# Patient Record
Sex: Female | Born: 1950 | Race: White | Hispanic: No | Smoking: Former smoker
Health system: Southern US, Community
[De-identification: ages and names within clinical notes are randomized; demographics above are authoritative.]

## PROBLEM LIST (undated history)

## (undated) DIAGNOSIS — I10 Essential (primary) hypertension: Secondary | ICD-10-CM

## (undated) DIAGNOSIS — T8859XA Other complications of anesthesia, initial encounter: Secondary | ICD-10-CM

## (undated) DIAGNOSIS — J449 Chronic obstructive pulmonary disease, unspecified: Secondary | ICD-10-CM

## (undated) DIAGNOSIS — F32A Depression, unspecified: Secondary | ICD-10-CM

## (undated) DIAGNOSIS — G2581 Restless legs syndrome: Secondary | ICD-10-CM

## (undated) DIAGNOSIS — E785 Hyperlipidemia, unspecified: Secondary | ICD-10-CM

## (undated) DIAGNOSIS — K219 Gastro-esophageal reflux disease without esophagitis: Secondary | ICD-10-CM

## (undated) DIAGNOSIS — F419 Anxiety disorder, unspecified: Secondary | ICD-10-CM

## (undated) DIAGNOSIS — M5030 Other cervical disc degeneration, unspecified cervical region: Secondary | ICD-10-CM

## (undated) DIAGNOSIS — Z9981 Dependence on supplemental oxygen: Secondary | ICD-10-CM

## (undated) DIAGNOSIS — E669 Obesity, unspecified: Secondary | ICD-10-CM

## (undated) DIAGNOSIS — F319 Bipolar disorder, unspecified: Secondary | ICD-10-CM

## (undated) DIAGNOSIS — R0600 Dyspnea, unspecified: Secondary | ICD-10-CM

## (undated) HISTORY — PX: ANKLE HARDWARE REMOVAL: SHX1149

## (undated) HISTORY — PX: AUGMENTATION MAMMAPLASTY: SUR837

## (undated) HISTORY — PX: ANKLE FRACTURE SURGERY: SHX122

## (undated) HISTORY — PX: COLONOSCOPY: SHX174

## (undated) HISTORY — PX: TUBAL LIGATION: SHX77

---

## 2008-03-18 ENCOUNTER — Ambulatory Visit: Payer: Self-pay | Admitting: Gastroenterology

## 2008-07-11 ENCOUNTER — Ambulatory Visit: Payer: Self-pay | Admitting: Family Medicine

## 2010-08-02 ENCOUNTER — Ambulatory Visit: Payer: Self-pay | Admitting: Family Medicine

## 2010-10-23 NOTE — Assessment & Plan Note (Signed)
Summary: FLU SHOT/EVM  Nurse Visit   Immunizations Administered:  Influenza Vaccine:    Vaccine Type: FLULAVAL    Site: left deltoid    Mfr: GlaxoSmithKline    Dose: 0.5 ml    Route: IM    Given by: Levonne Spiller EMT-P    Exp. Date: 03/23/2011    Lot #: [id number]    VIS given: 04/17/10 version given August 02, 2010.   Immunizations Administered:  Influenza Vaccine:    Vaccine Type: FLULAVAL    Site: left deltoid    Mfr: GlaxoSmithKline    Dose: 0.5 ml    Route: IM    Given by: Levonne Spiller EMT-P    Exp. Date: 03/23/2011    Lot #: [id number]    VIS given: 04/17/10 version given August 02, 2010.  Flu Vaccine Consent Questions:    Do you have a history of severe allergic reactions to this vaccine? no    Any prior history of allergic reactions to egg and/or gelatin? no    Do you have a sensitivity to the preservative Thimersol? no    Do you have a past history of Guillan-Barre Syndrome? no    Do you currently have an acute febrile illness? no    Have you ever had a severe reaction to latex? no    Vaccine information given and explained to patient? yes    Are you currently pregnant? no

## 2011-01-30 ENCOUNTER — Ambulatory Visit: Payer: Self-pay | Admitting: Internal Medicine

## 2012-04-30 ENCOUNTER — Ambulatory Visit: Payer: Self-pay | Admitting: Internal Medicine

## 2013-05-03 ENCOUNTER — Ambulatory Visit: Payer: Self-pay | Admitting: Internal Medicine

## 2013-08-24 ENCOUNTER — Ambulatory Visit: Payer: Self-pay | Admitting: Gastroenterology

## 2013-11-29 ENCOUNTER — Other Ambulatory Visit: Payer: Self-pay | Admitting: Gastroenterology

## 2013-11-29 LAB — CLOSTRIDIUM DIFFICILE(ARMC)

## 2013-12-03 LAB — STOOL CULTURE

## 2014-04-27 DIAGNOSIS — E785 Hyperlipidemia, unspecified: Secondary | ICD-10-CM | POA: Insufficient documentation

## 2014-04-27 DIAGNOSIS — F319 Bipolar disorder, unspecified: Secondary | ICD-10-CM | POA: Insufficient documentation

## 2014-04-27 DIAGNOSIS — I10 Essential (primary) hypertension: Secondary | ICD-10-CM | POA: Insufficient documentation

## 2014-04-27 DIAGNOSIS — G2581 Restless legs syndrome: Secondary | ICD-10-CM | POA: Insufficient documentation

## 2014-04-27 DIAGNOSIS — M5030 Other cervical disc degeneration, unspecified cervical region: Secondary | ICD-10-CM | POA: Insufficient documentation

## 2014-06-01 ENCOUNTER — Ambulatory Visit: Payer: Self-pay | Admitting: Internal Medicine

## 2014-09-25 ENCOUNTER — Ambulatory Visit: Payer: Self-pay | Admitting: Physician Assistant

## 2014-09-25 DIAGNOSIS — M549 Dorsalgia, unspecified: Secondary | ICD-10-CM | POA: Diagnosis not present

## 2014-09-25 DIAGNOSIS — I10 Essential (primary) hypertension: Secondary | ICD-10-CM | POA: Diagnosis not present

## 2014-09-25 DIAGNOSIS — G8929 Other chronic pain: Secondary | ICD-10-CM | POA: Diagnosis not present

## 2014-09-25 DIAGNOSIS — B351 Tinea unguium: Secondary | ICD-10-CM | POA: Diagnosis not present

## 2014-09-25 DIAGNOSIS — F319 Bipolar disorder, unspecified: Secondary | ICD-10-CM | POA: Diagnosis not present

## 2014-09-25 DIAGNOSIS — L089 Local infection of the skin and subcutaneous tissue, unspecified: Secondary | ICD-10-CM | POA: Diagnosis not present

## 2014-11-01 DIAGNOSIS — R7309 Other abnormal glucose: Secondary | ICD-10-CM | POA: Diagnosis not present

## 2014-11-01 DIAGNOSIS — E119 Type 2 diabetes mellitus without complications: Secondary | ICD-10-CM | POA: Diagnosis not present

## 2014-11-08 DIAGNOSIS — I10 Essential (primary) hypertension: Secondary | ICD-10-CM | POA: Diagnosis not present

## 2015-01-31 DIAGNOSIS — I10 Essential (primary) hypertension: Secondary | ICD-10-CM | POA: Diagnosis not present

## 2015-02-07 DIAGNOSIS — I10 Essential (primary) hypertension: Secondary | ICD-10-CM | POA: Diagnosis not present

## 2015-02-07 DIAGNOSIS — R739 Hyperglycemia, unspecified: Secondary | ICD-10-CM | POA: Diagnosis not present

## 2015-02-07 DIAGNOSIS — E780 Pure hypercholesterolemia: Secondary | ICD-10-CM | POA: Diagnosis not present

## 2015-05-02 DIAGNOSIS — E780 Pure hypercholesterolemia: Secondary | ICD-10-CM | POA: Diagnosis not present

## 2015-05-02 DIAGNOSIS — I10 Essential (primary) hypertension: Secondary | ICD-10-CM | POA: Diagnosis not present

## 2015-05-02 DIAGNOSIS — R739 Hyperglycemia, unspecified: Secondary | ICD-10-CM | POA: Diagnosis not present

## 2015-05-08 DIAGNOSIS — F319 Bipolar disorder, unspecified: Secondary | ICD-10-CM | POA: Diagnosis not present

## 2015-05-08 DIAGNOSIS — I10 Essential (primary) hypertension: Secondary | ICD-10-CM | POA: Diagnosis not present

## 2015-05-08 DIAGNOSIS — Z1231 Encounter for screening mammogram for malignant neoplasm of breast: Secondary | ICD-10-CM | POA: Diagnosis not present

## 2015-05-08 DIAGNOSIS — M79605 Pain in left leg: Secondary | ICD-10-CM | POA: Diagnosis not present

## 2015-05-08 DIAGNOSIS — Z0001 Encounter for general adult medical examination with abnormal findings: Secondary | ICD-10-CM | POA: Diagnosis not present

## 2015-05-08 DIAGNOSIS — M5030 Other cervical disc degeneration, unspecified cervical region: Secondary | ICD-10-CM | POA: Diagnosis not present

## 2015-05-08 DIAGNOSIS — Z23 Encounter for immunization: Secondary | ICD-10-CM | POA: Diagnosis not present

## 2015-05-08 DIAGNOSIS — G2581 Restless legs syndrome: Secondary | ICD-10-CM | POA: Diagnosis not present

## 2015-05-08 DIAGNOSIS — E780 Pure hypercholesterolemia: Secondary | ICD-10-CM | POA: Diagnosis not present

## 2015-06-19 ENCOUNTER — Other Ambulatory Visit: Payer: Self-pay | Admitting: Internal Medicine

## 2015-06-19 DIAGNOSIS — Z1231 Encounter for screening mammogram for malignant neoplasm of breast: Secondary | ICD-10-CM

## 2015-06-29 ENCOUNTER — Ambulatory Visit
Admission: RE | Admit: 2015-06-29 | Discharge: 2015-06-29 | Disposition: A | Discharge status: Home / Self Care | Payer: Commercial Managed Care - HMO | Source: Ambulatory Visit | Attending: Internal Medicine | Admitting: Internal Medicine

## 2015-06-29 ENCOUNTER — Other Ambulatory Visit: Payer: Self-pay | Admitting: Internal Medicine

## 2015-06-29 DIAGNOSIS — Z1231 Encounter for screening mammogram for malignant neoplasm of breast: Secondary | ICD-10-CM | POA: Insufficient documentation

## 2015-08-01 DIAGNOSIS — R7309 Other abnormal glucose: Secondary | ICD-10-CM | POA: Diagnosis not present

## 2015-08-01 DIAGNOSIS — R300 Dysuria: Secondary | ICD-10-CM | POA: Diagnosis not present

## 2015-08-01 DIAGNOSIS — I10 Essential (primary) hypertension: Secondary | ICD-10-CM | POA: Diagnosis not present

## 2015-08-08 DIAGNOSIS — E7800 Pure hypercholesterolemia, unspecified: Secondary | ICD-10-CM | POA: Diagnosis not present

## 2015-08-08 DIAGNOSIS — G2581 Restless legs syndrome: Secondary | ICD-10-CM | POA: Diagnosis not present

## 2015-08-08 DIAGNOSIS — I10 Essential (primary) hypertension: Secondary | ICD-10-CM | POA: Diagnosis not present

## 2015-10-26 DIAGNOSIS — K580 Irritable bowel syndrome with diarrhea: Secondary | ICD-10-CM | POA: Diagnosis not present

## 2015-11-08 DIAGNOSIS — I10 Essential (primary) hypertension: Secondary | ICD-10-CM | POA: Diagnosis not present

## 2015-11-08 DIAGNOSIS — G2581 Restless legs syndrome: Secondary | ICD-10-CM | POA: Diagnosis not present

## 2015-11-08 DIAGNOSIS — F319 Bipolar disorder, unspecified: Secondary | ICD-10-CM | POA: Diagnosis not present

## 2015-11-08 DIAGNOSIS — R739 Hyperglycemia, unspecified: Secondary | ICD-10-CM | POA: Diagnosis not present

## 2015-11-08 DIAGNOSIS — M5030 Other cervical disc degeneration, unspecified cervical region: Secondary | ICD-10-CM | POA: Diagnosis not present

## 2016-01-24 DIAGNOSIS — R739 Hyperglycemia, unspecified: Secondary | ICD-10-CM | POA: Diagnosis not present

## 2016-02-01 DIAGNOSIS — F319 Bipolar disorder, unspecified: Secondary | ICD-10-CM | POA: Diagnosis not present

## 2016-02-01 DIAGNOSIS — I10 Essential (primary) hypertension: Secondary | ICD-10-CM | POA: Diagnosis not present

## 2016-02-01 DIAGNOSIS — E7800 Pure hypercholesterolemia, unspecified: Secondary | ICD-10-CM | POA: Diagnosis not present

## 2016-05-01 DIAGNOSIS — E7800 Pure hypercholesterolemia, unspecified: Secondary | ICD-10-CM | POA: Diagnosis not present

## 2016-05-01 DIAGNOSIS — I10 Essential (primary) hypertension: Secondary | ICD-10-CM | POA: Diagnosis not present

## 2016-05-07 ENCOUNTER — Other Ambulatory Visit: Payer: Self-pay | Admitting: Internal Medicine

## 2016-05-07 DIAGNOSIS — Z1231 Encounter for screening mammogram for malignant neoplasm of breast: Secondary | ICD-10-CM

## 2016-05-07 DIAGNOSIS — I10 Essential (primary) hypertension: Secondary | ICD-10-CM | POA: Diagnosis not present

## 2016-05-07 DIAGNOSIS — G2581 Restless legs syndrome: Secondary | ICD-10-CM | POA: Diagnosis not present

## 2016-05-07 DIAGNOSIS — F319 Bipolar disorder, unspecified: Secondary | ICD-10-CM | POA: Diagnosis not present

## 2016-05-07 DIAGNOSIS — E7800 Pure hypercholesterolemia, unspecified: Secondary | ICD-10-CM | POA: Diagnosis not present

## 2016-05-07 DIAGNOSIS — Z0001 Encounter for general adult medical examination with abnormal findings: Secondary | ICD-10-CM | POA: Diagnosis not present

## 2016-07-09 ENCOUNTER — Ambulatory Visit
Admission: RE | Admit: 2016-07-09 | Discharge: 2016-07-09 | Disposition: A | Discharge status: Home / Self Care | Payer: Commercial Managed Care - HMO | Source: Ambulatory Visit | Attending: Internal Medicine | Admitting: Internal Medicine

## 2016-07-09 ENCOUNTER — Other Ambulatory Visit: Payer: Self-pay | Admitting: Internal Medicine

## 2016-07-09 DIAGNOSIS — Z1231 Encounter for screening mammogram for malignant neoplasm of breast: Secondary | ICD-10-CM

## 2016-07-30 DIAGNOSIS — I10 Essential (primary) hypertension: Secondary | ICD-10-CM | POA: Diagnosis not present

## 2016-08-06 DIAGNOSIS — I10 Essential (primary) hypertension: Secondary | ICD-10-CM | POA: Diagnosis not present

## 2016-10-29 DIAGNOSIS — I10 Essential (primary) hypertension: Secondary | ICD-10-CM | POA: Diagnosis not present

## 2016-11-05 DIAGNOSIS — F319 Bipolar disorder, unspecified: Secondary | ICD-10-CM | POA: Diagnosis not present

## 2016-11-05 DIAGNOSIS — I10 Essential (primary) hypertension: Secondary | ICD-10-CM | POA: Diagnosis not present

## 2016-11-05 DIAGNOSIS — E7800 Pure hypercholesterolemia, unspecified: Secondary | ICD-10-CM | POA: Diagnosis not present

## 2017-05-06 DIAGNOSIS — E7800 Pure hypercholesterolemia, unspecified: Secondary | ICD-10-CM | POA: Diagnosis not present

## 2017-05-06 DIAGNOSIS — I10 Essential (primary) hypertension: Secondary | ICD-10-CM | POA: Diagnosis not present

## 2017-05-12 DIAGNOSIS — K580 Irritable bowel syndrome with diarrhea: Secondary | ICD-10-CM | POA: Diagnosis not present

## 2017-05-12 DIAGNOSIS — Z86010 Personal history of colonic polyps: Secondary | ICD-10-CM | POA: Diagnosis not present

## 2017-05-13 DIAGNOSIS — I10 Essential (primary) hypertension: Secondary | ICD-10-CM | POA: Diagnosis not present

## 2017-05-13 DIAGNOSIS — Z0001 Encounter for general adult medical examination with abnormal findings: Secondary | ICD-10-CM | POA: Diagnosis not present

## 2017-05-13 DIAGNOSIS — E7800 Pure hypercholesterolemia, unspecified: Secondary | ICD-10-CM | POA: Diagnosis not present

## 2017-05-13 DIAGNOSIS — F319 Bipolar disorder, unspecified: Secondary | ICD-10-CM | POA: Diagnosis not present

## 2017-05-13 DIAGNOSIS — Z1231 Encounter for screening mammogram for malignant neoplasm of breast: Secondary | ICD-10-CM | POA: Diagnosis not present

## 2017-05-13 DIAGNOSIS — G2581 Restless legs syndrome: Secondary | ICD-10-CM | POA: Diagnosis not present

## 2017-05-13 DIAGNOSIS — M5030 Other cervical disc degeneration, unspecified cervical region: Secondary | ICD-10-CM | POA: Diagnosis not present

## 2017-05-19 ENCOUNTER — Other Ambulatory Visit: Payer: Self-pay | Admitting: Internal Medicine

## 2017-05-19 DIAGNOSIS — Z1231 Encounter for screening mammogram for malignant neoplasm of breast: Secondary | ICD-10-CM

## 2017-05-30 DIAGNOSIS — Z86010 Personal history of colonic polyps: Secondary | ICD-10-CM | POA: Diagnosis not present

## 2017-07-15 ENCOUNTER — Ambulatory Visit
Admission: RE | Admit: 2017-07-15 | Discharge: 2017-07-15 | Disposition: A | Discharge status: Home / Self Care | Payer: Commercial Managed Care - HMO | Source: Ambulatory Visit | Attending: Internal Medicine | Admitting: Internal Medicine

## 2017-07-15 DIAGNOSIS — Z9882 Breast implant status: Secondary | ICD-10-CM | POA: Insufficient documentation

## 2017-07-15 DIAGNOSIS — Z1231 Encounter for screening mammogram for malignant neoplasm of breast: Secondary | ICD-10-CM | POA: Diagnosis not present

## 2017-11-04 DIAGNOSIS — E7800 Pure hypercholesterolemia, unspecified: Secondary | ICD-10-CM | POA: Diagnosis not present

## 2017-11-04 DIAGNOSIS — I10 Essential (primary) hypertension: Secondary | ICD-10-CM | POA: Diagnosis not present

## 2017-11-11 DIAGNOSIS — Z6841 Body Mass Index (BMI) 40.0 and over, adult: Secondary | ICD-10-CM | POA: Diagnosis not present

## 2017-11-11 DIAGNOSIS — F3178 Bipolar disorder, in full remission, most recent episode mixed: Secondary | ICD-10-CM | POA: Diagnosis not present

## 2017-11-11 DIAGNOSIS — M5030 Other cervical disc degeneration, unspecified cervical region: Secondary | ICD-10-CM | POA: Diagnosis not present

## 2017-11-11 DIAGNOSIS — E7800 Pure hypercholesterolemia, unspecified: Secondary | ICD-10-CM | POA: Diagnosis not present

## 2017-11-11 DIAGNOSIS — I10 Essential (primary) hypertension: Secondary | ICD-10-CM | POA: Diagnosis not present

## 2017-11-11 DIAGNOSIS — F319 Bipolar disorder, unspecified: Secondary | ICD-10-CM | POA: Diagnosis not present

## 2017-11-11 DIAGNOSIS — R0602 Shortness of breath: Secondary | ICD-10-CM | POA: Diagnosis not present

## 2018-02-10 DIAGNOSIS — F411 Generalized anxiety disorder: Secondary | ICD-10-CM | POA: Diagnosis not present

## 2018-02-10 DIAGNOSIS — M5030 Other cervical disc degeneration, unspecified cervical region: Secondary | ICD-10-CM | POA: Diagnosis not present

## 2018-02-10 DIAGNOSIS — E7800 Pure hypercholesterolemia, unspecified: Secondary | ICD-10-CM | POA: Diagnosis not present

## 2018-02-10 DIAGNOSIS — Z0000 Encounter for general adult medical examination without abnormal findings: Secondary | ICD-10-CM | POA: Diagnosis not present

## 2018-02-10 DIAGNOSIS — F319 Bipolar disorder, unspecified: Secondary | ICD-10-CM | POA: Diagnosis not present

## 2018-02-10 DIAGNOSIS — I10 Essential (primary) hypertension: Secondary | ICD-10-CM | POA: Diagnosis not present

## 2018-05-12 DIAGNOSIS — I10 Essential (primary) hypertension: Secondary | ICD-10-CM | POA: Diagnosis not present

## 2018-05-19 DIAGNOSIS — E7800 Pure hypercholesterolemia, unspecified: Secondary | ICD-10-CM | POA: Diagnosis not present

## 2018-05-19 DIAGNOSIS — F319 Bipolar disorder, unspecified: Secondary | ICD-10-CM | POA: Diagnosis not present

## 2018-05-19 DIAGNOSIS — M5030 Other cervical disc degeneration, unspecified cervical region: Secondary | ICD-10-CM | POA: Diagnosis not present

## 2018-05-19 DIAGNOSIS — I10 Essential (primary) hypertension: Secondary | ICD-10-CM | POA: Diagnosis not present

## 2018-07-06 ENCOUNTER — Other Ambulatory Visit: Payer: Self-pay | Admitting: Internal Medicine

## 2018-07-06 DIAGNOSIS — Z1231 Encounter for screening mammogram for malignant neoplasm of breast: Secondary | ICD-10-CM

## 2018-07-16 ENCOUNTER — Ambulatory Visit
Admission: RE | Admit: 2018-07-16 | Discharge: 2018-07-16 | Disposition: A | Discharge status: Home / Self Care | Payer: Medicare HMO | Source: Ambulatory Visit | Attending: Internal Medicine | Admitting: Internal Medicine

## 2018-07-16 DIAGNOSIS — Z1231 Encounter for screening mammogram for malignant neoplasm of breast: Secondary | ICD-10-CM | POA: Diagnosis not present

## 2018-07-28 ENCOUNTER — Emergency Department: Payer: Medicare HMO

## 2018-07-28 ENCOUNTER — Emergency Department
Admission: EM | Admit: 2018-07-28 | Discharge: 2018-07-28 | Disposition: A | Discharge status: Home / Self Care | Payer: Medicare HMO | Attending: Student in an Organized Health Care Education/Training Program | Admitting: Student in an Organized Health Care Education/Training Program

## 2018-07-28 ENCOUNTER — Encounter: Payer: Self-pay | Admitting: Emergency Medicine

## 2018-07-28 ENCOUNTER — Other Ambulatory Visit: Payer: Self-pay

## 2018-07-28 DIAGNOSIS — R0902 Hypoxemia: Secondary | ICD-10-CM | POA: Diagnosis not present

## 2018-07-28 DIAGNOSIS — Z87891 Personal history of nicotine dependence: Secondary | ICD-10-CM | POA: Diagnosis not present

## 2018-07-28 DIAGNOSIS — R0602 Shortness of breath: Secondary | ICD-10-CM

## 2018-07-28 DIAGNOSIS — Z5329 Procedure and treatment not carried out because of patient's decision for other reasons: Secondary | ICD-10-CM | POA: Insufficient documentation

## 2018-07-28 DIAGNOSIS — J9601 Acute respiratory failure with hypoxia: Secondary | ICD-10-CM

## 2018-07-28 DIAGNOSIS — R000 Tachycardia, unspecified: Secondary | ICD-10-CM | POA: Diagnosis not present

## 2018-07-28 LAB — CBC
HEMATOCRIT: 47.1 % — AB (ref 36.0–46.0)
HEMOGLOBIN: 14.8 g/dL (ref 12.0–15.0)
MCH: 31.2 pg (ref 26.0–34.0)
MCHC: 31.4 g/dL (ref 30.0–36.0)
MCV: 99.2 fL (ref 80.0–100.0)
Platelets: 291 10*3/uL (ref 150–400)
RBC: 4.75 MIL/uL (ref 3.87–5.11)
RDW: 12.7 % (ref 11.5–15.5)
WBC: 10.6 10*3/uL — AB (ref 4.0–10.5)
nRBC: 0 % (ref 0.0–0.2)

## 2018-07-28 LAB — BASIC METABOLIC PANEL
Anion gap: 8 (ref 5–15)
BUN: 22 mg/dL (ref 8–23)
CHLORIDE: 103 mmol/L (ref 98–111)
CO2: 29 mmol/L (ref 22–32)
Calcium: 9.3 mg/dL (ref 8.9–10.3)
Creatinine, Ser: 0.75 mg/dL (ref 0.44–1.00)
GFR calc Af Amer: >60 mL/min (ref >60)
GFR calc non Af Amer: >60 mL/min (ref >60)
Glucose, Bld: 163 mg/dL — ABNORMAL HIGH (ref 70–99)
POTASSIUM: 4.2 mmol/L (ref 3.5–5.1)
Sodium: 140 mmol/L (ref 135–145)

## 2018-07-28 LAB — BRAIN NATRIURETIC PEPTIDE: B Natriuretic Peptide: 8 pg/mL (ref 0.0–100.0)

## 2018-07-28 MED ORDER — IPRATROPIUM-ALBUTEROL 0.5-2.5 (3) MG/3ML IN SOLN: 3.0000 mL | Freq: Four times a day (QID) | RESPIRATORY_TRACT | 1 refills | Status: AC | PRN

## 2018-07-28 MED ORDER — IPRATROPIUM-ALBUTEROL 0.5-2.5 (3) MG/3ML IN SOLN
3.0000 mL | Freq: Once | RESPIRATORY_TRACT | Status: AC
  Administered 2018-07-28: 3 mL via RESPIRATORY_TRACT
  Filled 2018-07-28: qty 3

## 2018-07-28 MED ORDER — PREDNISONE 20 MG PO TABS
60.0000 mg | ORAL_TABLET | Freq: Once | ORAL | Status: AC
  Administered 2018-07-28: 60 mg via ORAL
  Filled 2018-07-28: qty 3

## 2018-07-28 MED ORDER — ALBUTEROL SULFATE HFA 108 (90 BASE) MCG/ACT IN AERS: 2.0000 | INHALATION_SPRAY | Freq: Four times a day (QID) | RESPIRATORY_TRACT | 2 refills | Status: AC | PRN

## 2018-07-28 MED ORDER — PREDNISONE 20 MG PO TABS: 40.0000 mg | ORAL_TABLET | Freq: Every day | ORAL | 0 refills | Status: AC

## 2018-07-28 MED ORDER — ALBUTEROL SULFATE (2.5 MG/3ML) 0.083% IN NEBU
5.0000 mg | INHALATION_SOLUTION | Freq: Once | RESPIRATORY_TRACT | Status: AC
  Administered 2018-07-28: 5 mg via RESPIRATORY_TRACT
  Filled 2018-07-28: qty 6

## 2018-07-28 MED ORDER — METHYLPREDNISOLONE SODIUM SUCC 125 MG IJ SOLR: 125.0000 mg | Freq: Once | INTRAMUSCULAR | Status: DC

## 2018-07-28 NOTE — ED Notes (Signed)
Patient requested to speak Dr. Quentin Cornwall. Patient states she is not staying and wants to go home. Dr. Quentin Cornwall aware.

## 2018-07-28 NOTE — Discharge Instructions (Addendum)
Please take the steroids daily starting tomorrow.  Use the albuterol inhaler every 4 hours while awake for the next 3 days.  Please return to the ER after you have gotten your affairs at home coordinated for reevaluation as you are leaving [place] there are other pathologies that can cause your symptoms including but not limited to congestive heart failure, pulmonary embolism, myocardial infarction, pneumonia.

## 2018-07-28 NOTE — ED Provider Notes (Signed)
[facility] Emergency Department Provider Note    First MD Initiated Contact with Patient 07/28/18 1759     (approximate)  I have reviewed the triage vital signs and the nursing notes.   HISTORY  Chief Complaint Shortness of Breath    HPI Marissa Hayden is a 67 y.o. female with a remote smoking history presents with 1 year of shortness of breath is becoming increasingly worse particularly noticing exertional dyspnea.  She denies any chest pain with orthopnea.  Is beginning progressively worse in the past several days.  Denies any nausea vomiting or diarrhea.  Denies any chest pain.  No pain with deep inspiration.  No leg swelling.  Denies any history of heart failure.  States that she stopped smoking 10 years ago.   History reviewed. No pertinent past medical history. Family History  Problem Relation Age of Onset  . Breast cancer Sister 59   Past Surgical History:  Procedure Laterality Date  . AUGMENTATION MAMMAPLASTY Bilateral    over 30  years ago   There are no active problems to display for this patient.     Prior to Admission medications   Medication Sig Start Date End Date Taking? Authorizing Provider  albuterol (PROVENTIL HFA;VENTOLIN HFA) 108 (90 Base) MCG/ACT inhaler Inhale 2 puffs into the lungs every 6 (six) hours as needed for wheezing or shortness of breath. 07/28/18   Merlyn Lot, MD  predniSONE (DELTASONE) 20 MG tablet Take 2 tablets (40 mg total) by mouth daily for 5 days. 07/28/18 08/02/18  Merlyn Lot, MD    Allergies Patient has no known allergies.    Social History Social History   Tobacco Use  . Smoking status: Former Research scientist (life sciences)  . Smokeless tobacco: Never Used  Substance Use Topics  . Alcohol use: Never    Frequency: Never  . Drug use: Never    Review of Systems Patient denies headaches, rhinorrhea, blurry vision, numbness, shortness of breath, chest pain, edema, cough, abdominal pain, nausea,  vomiting, diarrhea, dysuria, fevers, rashes or hallucinations unless otherwise stated above in HPI. ____________________________________________   PHYSICAL EXAM:  VITAL SIGNS: Vitals:   07/28/18 1645 07/28/18 1650  BP: (!) 143/98   Pulse: (!) 104   Resp: (!) 22   Temp: 98.4 F (36.9 C)   SpO2: (!) 89% 95%    Constitutional: Alert and oriented.  Eyes: Conjunctivae are normal.  Head: Atraumatic. Nose: No congestion/rhinnorhea. Mouth/Throat: Mucous membranes are moist.   Neck: No stridor. Painless ROM.  Cardiovascular: Normal rate, regular rhythm. Grossly normal heart sounds.  Good peripheral circulation. Respiratory: mild tachypnea Gastrointestinal: Soft and nontender. No distention. No abdominal bruits. No CVA tenderness. Genitourinary:  Musculoskeletal: No lower extremity tenderness nor edema.  No joint effusions. Neurologic:  Normal speech and language. No gross focal neurologic deficits are appreciated. No facial droop Skin:  Skin is warm, dry and intact. No rash noted. Psychiatric: Mood and affect are normal. Speech and behavior are normal.  ____________________________________________   LABS (all labs ordered are listed, but only abnormal results are displayed)  Results for orders placed or performed during the hospital encounter of 07/28/18 (from the past 24 hour(s))  Basic metabolic panel     Status: Abnormal   Collection Time: 07/28/18  4:54 PM  Result Value Ref Range   Sodium 140 135 - 145 mmol/L   Potassium 4.2 3.5 - 5.1 mmol/L   Chloride 103 98 - 111 mmol/L   CO2 29 22 - 32 mmol/L  Glucose, Bld 163 (H) 70 - 99 mg/dL   BUN 22 8 - 23 mg/dL   Creatinine, Ser 0.75 0.44 - 1.00 mg/dL   Calcium 9.3 8.9 - 10.3 mg/dL   GFR calc non Af Amer >60 >60 mL/min   GFR calc Af Amer >60 >60 mL/min   Anion gap 8 5 - 15  CBC     Status: Abnormal   Collection Time: 07/28/18  4:54 PM  Result Value Ref Range   WBC 10.6 (H) 4.0 - 10.5 K/uL   RBC 4.75 3.87 - 5.11 MIL/uL    Hemoglobin 14.8 12.0 - 15.0 g/dL   HCT 47.1 (H) 36.0 - 46.0 %   MCV 99.2 80.0 - 100.0 fL   MCH 31.2 26.0 - 34.0 pg   MCHC 31.4 30.0 - 36.0 g/dL   RDW 12.7 11.5 - 15.5 %   Platelets 291 150 - 400 K/uL   nRBC 0.0 0.0 - 0.2 %   ____________________________________________  EKG My review and personal interpretation at Time: 16:47   Indication: sob  Rate: 105  Rhythm: sinus Axis: normal Other: normal intervals, no stemi ____________________________________________  RADIOLOGY  I personally reviewed all radiographic images ordered to evaluate for the above acute complaints and reviewed radiology reports and findings.  These findings were personally discussed with the patient.  Please see medical record for radiology report.  ____________________________________________   PROCEDURES  Procedure(s) performed:  Procedures    Critical Care performed: no ____________________________________________   INITIAL IMPRESSION / ASSESSMENT AND PLAN / ED COURSE  Pertinent labs & imaging results that were available during my care of the patient were reviewed by me and considered in my medical decision making (see chart for details).   DDX: Asthma, copd, CHF, pna, ptx, malignancy, Pe, anemia   Marissa Hayden is a 67 y.o. who presents to the ED with symptoms as described above.  Patient does have expiratory wheezing bilaterally concerning for COPD or bronchitis.  No evidence of congestive heart failure.  Patient denies any chest pain.  Patient was given albuterol in triage with improvement in symptoms.  Patient noted to have acute hypoxia is feeling much better now.  I did recommend serial nebulizers as well as IV steroids.  Patient was placed in hall bed and was informed that she will be moved to a ER room the patient states that she does not want any additional treatment or to be admitted to the hospital and wants to leave at this moment.  I informed the patient that I am concerned about  her well-being and medical condition especially her respiratory status and that leaving at this time would prevent me from further evaluating any emergent or life-threatening pathology such as worsening respiratory failure, pulmonary embolism, heart failure, coronary disease.  Patient was informed that leaving [place] at this time could result in irreversible harm, deterioration and even death.  Patient states "if God says it is my time to go to that I am ready.  ".  She does agree to stay for additional nebulizer as well as oral prednisone.  Strongly encourage the patient to return to the ER this evening after she is gotten her affairs at home sorted or even tomorrow if that is more convenient.      As part of my medical decision making, I reviewed the following data within the [facility] notes reviewed and incorporated, Labs reviewed, notes from prior ED visits.  ____________________________________________   FINAL CLINICAL IMPRESSION(S) /  ED DIAGNOSES  Final diagnoses:  SOB (shortness of breath)  Acute respiratory failure with hypoxia (HCC)      NEW MEDICATIONS STARTED DURING THIS VISIT:  New Prescriptions   ALBUTEROL (PROVENTIL HFA;VENTOLIN HFA) 108 (90 BASE) MCG/ACT INHALER    Inhale 2 puffs into the lungs every 6 (six) hours as needed for wheezing or shortness of breath.   PREDNISONE (DELTASONE) 20 MG TABLET    Take 2 tablets (40 mg total) by mouth daily for 5 days.     Note:  This document was prepared using Dragon voice recognition software and may include unintentional dictation errors.    Merlyn Lot, MD 07/28/18 [phone]

## 2018-07-28 NOTE — ED Triage Notes (Signed)
Patient reports worsening SOB for the past 2 months. Denies fever or cough. Denies history of COPD or CHF. Symptoms worse with exertion.

## 2018-07-28 NOTE — ED Notes (Signed)
Patient reports relief of shortness of breath with duo neb treatments.

## 2018-07-28 NOTE — ED Notes (Signed)
Patient has family who is picking her up. Patient was brought to the lobby via wheelchair.

## 2018-08-04 DIAGNOSIS — N3281 Overactive bladder: Secondary | ICD-10-CM | POA: Diagnosis not present

## 2018-08-04 DIAGNOSIS — I10 Essential (primary) hypertension: Secondary | ICD-10-CM | POA: Diagnosis not present

## 2018-08-04 DIAGNOSIS — R0602 Shortness of breath: Secondary | ICD-10-CM | POA: Diagnosis not present

## 2018-08-04 DIAGNOSIS — M5030 Other cervical disc degeneration, unspecified cervical region: Secondary | ICD-10-CM | POA: Diagnosis not present

## 2018-08-04 DIAGNOSIS — F319 Bipolar disorder, unspecified: Secondary | ICD-10-CM | POA: Diagnosis not present

## 2018-08-04 DIAGNOSIS — E7800 Pure hypercholesterolemia, unspecified: Secondary | ICD-10-CM | POA: Diagnosis not present

## 2018-08-11 DIAGNOSIS — J449 Chronic obstructive pulmonary disease, unspecified: Secondary | ICD-10-CM | POA: Diagnosis not present

## 2018-08-11 DIAGNOSIS — R0609 Other forms of dyspnea: Secondary | ICD-10-CM | POA: Diagnosis not present

## 2018-09-25 DIAGNOSIS — H2513 Age-related nuclear cataract, bilateral: Secondary | ICD-10-CM | POA: Diagnosis not present

## 2019-02-22 DIAGNOSIS — J432 Centrilobular emphysema: Secondary | ICD-10-CM | POA: Diagnosis not present

## 2019-02-22 DIAGNOSIS — R0600 Dyspnea, unspecified: Secondary | ICD-10-CM | POA: Diagnosis not present

## 2019-02-22 DIAGNOSIS — Z6841 Body Mass Index (BMI) 40.0 and over, adult: Secondary | ICD-10-CM | POA: Diagnosis not present

## 2019-03-03 DIAGNOSIS — Z86010 Personal history of colonic polyps: Secondary | ICD-10-CM | POA: Diagnosis not present

## 2019-03-18 DIAGNOSIS — E7800 Pure hypercholesterolemia, unspecified: Secondary | ICD-10-CM | POA: Diagnosis not present

## 2019-03-18 DIAGNOSIS — I10 Essential (primary) hypertension: Secondary | ICD-10-CM | POA: Diagnosis not present

## 2019-03-25 DIAGNOSIS — Z0001 Encounter for general adult medical examination with abnormal findings: Secondary | ICD-10-CM | POA: Diagnosis not present

## 2019-03-25 DIAGNOSIS — F411 Generalized anxiety disorder: Secondary | ICD-10-CM | POA: Diagnosis not present

## 2019-03-25 DIAGNOSIS — E7800 Pure hypercholesterolemia, unspecified: Secondary | ICD-10-CM | POA: Diagnosis not present

## 2019-03-25 DIAGNOSIS — Z0000 Encounter for general adult medical examination without abnormal findings: Secondary | ICD-10-CM | POA: Diagnosis not present

## 2019-03-25 DIAGNOSIS — I10 Essential (primary) hypertension: Secondary | ICD-10-CM | POA: Diagnosis not present

## 2019-05-03 DIAGNOSIS — Z86010 Personal history of colonic polyps: Secondary | ICD-10-CM | POA: Diagnosis not present

## 2019-07-01 ENCOUNTER — Other Ambulatory Visit: Payer: Self-pay | Admitting: Internal Medicine

## 2019-07-01 DIAGNOSIS — Z1231 Encounter for screening mammogram for malignant neoplasm of breast: Secondary | ICD-10-CM

## 2019-09-23 DIAGNOSIS — I10 Essential (primary) hypertension: Secondary | ICD-10-CM | POA: Diagnosis not present

## 2019-09-24 ENCOUNTER — Encounter: Payer: Self-pay | Admitting: Emergency Medicine

## 2019-09-24 ENCOUNTER — Other Ambulatory Visit: Payer: Self-pay

## 2019-09-24 ENCOUNTER — Ambulatory Visit
Admission: EM | Admit: 2019-09-24 | Discharge: 2019-09-24 | Disposition: A | Discharge status: Home / Self Care | Payer: Medicare HMO | Attending: Family Medicine | Admitting: Family Medicine

## 2019-09-24 DIAGNOSIS — K120 Recurrent oral aphthae: Secondary | ICD-10-CM | POA: Diagnosis not present

## 2019-09-24 DIAGNOSIS — K047 Periapical abscess without sinus: Secondary | ICD-10-CM

## 2019-09-24 MED ORDER — AMOXICILLIN-POT CLAVULANATE 875-125 MG PO TABS: 1.0000 | ORAL_TABLET | Freq: Two times a day (BID) | ORAL | 0 refills | Status: DC

## 2019-09-24 MED ORDER — KETOROLAC TROMETHAMINE 10 MG PO TABS: 10.0000 mg | ORAL_TABLET | Freq: Four times a day (QID) | ORAL | 0 refills | Status: DC | PRN

## 2019-09-24 NOTE — Discharge Instructions (Signed)
Continue your home pain medication.  Toradol for severe pain.  Antibiotic as prescribed.  See dentist.  If you worsen, go to the hospital.  Take care  Dr. Lacinda Axon

## 2019-09-24 NOTE — ED Triage Notes (Signed)
Patient c/o mouth ulcers for the past couple of days and swelling and tenderness in her lower gums.

## 2019-09-24 NOTE — ED Provider Notes (Signed)
[facility]    CSN: [id number] Arrival date & time: 09/24/19  0857      History   Chief Complaint Chief Complaint  Patient presents with  . Mouth Lesions   HPI  69 year old female presents with jaw pain.  Patient reports that she has recently developed some ulcerations in her mouth.  She states that this started a few days ago.  She has now developed right-sided lower jaw swelling and pain.  She is concerned that she has an infection.  She rates her pain as 4/10 in severity.  Mouth lesions seem to be improving with topical Benadryl and milk of magnesia.  She reports that her pain is severe and interfering with sleep.  Exacerbated by touch and eating.  No other complaints.  PMH, Surgical Hx, Family Hx, Social History reviewed and updated as below.  PMH: Chronic diarrhea, unspecified    Depression    Anxiety    GERD (gastroesophageal reflux disease)    DDD (degenerative disc disease), cervical     Chronic pain  Surgical Hx: FRACTURE SURGERY  Right Right ankle fracture   TUBAL LIGATION         Home Medications    Prior to Admission medications   Medication Sig Start Date End Date Taking? Authorizing Provider  albuterol (PROVENTIL HFA;VENTOLIN HFA) 108 (90 Base) MCG/ACT inhaler Inhale 2 puffs into the lungs every 6 (six) hours as needed for wheezing or shortness of breath. 07/28/18  Yes Merlyn Lot, MD  cholestyramine light (PREVALITE) 4 GM/DOSE powder SMARTSIG:1 Scoopful By Mouth Twice Daily 05/19/19  Yes [provider]  citalopram (CELEXA) 20 MG tablet TAKE 1 TABLET EVERY DAY 12/04/18  Yes [provider]  ipratropium-albuterol (DUONEB) 0.5-2.5 (3) MG/3ML SOLN Take 3 mLs by nebulization every 6 (six) hours as needed. 07/28/18  Yes Merlyn Lot, MD  lisinopril (ZESTRIL) 10 MG tablet TAKE 1 TABLET EVERY DAY 04/02/19  Yes [provider]  oxybutynin (DITROPAN) 5 MG tablet TAKE 1 TABLET TWICE DAILY 09/23/19  Yes  [provider]  oxyCODONE-acetaminophen (PERCOCET/ROXICET) 5-325 MG tablet TAKE ONE AND ONE-HALF TABLET BY MOUTH EVERY 6 HOURS AS NEEDED FOR PAIN 09/03/19  Yes [provider]  simvastatin (ZOCOR) 40 MG tablet TAKE 1 TABLET EVERY DAY 04/02/19  Yes [provider]  amoxicillin-clavulanate (AUGMENTIN) 875-125 MG tablet Take 1 tablet by mouth every 12 (twelve) hours. 09/24/19   Coral Spikes, DO  ketorolac (TORADOL) 10 MG tablet Take 1 tablet (10 mg total) by mouth every 6 (six) hours as needed for moderate pain or severe pain. 09/24/19   Coral Spikes, DO    Family History Alzheimer's disease Mother Rondell Reams In memory care unit  Bipolar disorder Sister Becky Sax   Breast cancer Sister Peggy Boggs   High blood pressure (Hypertension) Sister Becky Sax    Social History Social History   Tobacco Use  . Smoking status: Former Research scientist (life sciences)  . Smokeless tobacco: Never Used  Substance Use Topics  . Alcohol use: Never  . Drug use: Never     Allergies   Patient has no known allergies.   Review of Systems Review of Systems  Constitutional: Negative for fever.  HENT: Positive for dental problem.    Physical Exam Triage Vital Signs ED Triage Vitals  Enc Vitals Group     BP 09/24/19 0912 135/75     Pulse Rate 09/24/19 0912 87     Resp 09/24/19 0912 16     Temp 09/24/19 0912  98.3 F (36.8 C)     Temp Source 09/24/19 0912 Oral     SpO2 09/24/19 0912 94 %     Weight 09/24/19 0909 245 lb (111.1 kg)     Height 09/24/19 0909 5' 1.5" (1.562 m)     Head Circumference --      Peak Flow --      Pain Score 09/24/19 0908 4     Pain Loc --      Pain Edu? --      Excl. in [place]? --    Updated Vital Signs BP 135/75 (BP Location: Left Arm)   Pulse 87   Temp 98.3 F (36.8 C) (Oral)   Resp 16   Ht 5' 1.5" (1.562 m)   Wt 111.1 kg   SpO2 94%   BMI 45.54 kg/m   Visual Acuity Right Eye Distance:   Left Eye Distance:   Bilateral Distance:    Right Eye Near:    Left Eye Near:    Bilateral Near:     Physical Exam Constitutional:      General: She is not in acute distress.    Appearance: Normal appearance. She is obese.  HENT:     Head: Normocephalic and atraumatic.     Mouth/Throat:     Comments: Poor dentition throughout. Right jaw swelling noted. Tender to palpation. Patient has a healing canker sore on the inside of the lower lip. Eyes:     General:        Right eye: No discharge.        Left eye: No discharge.     Conjunctiva/sclera: Conjunctivae normal.  Cardiovascular:     Rate and Rhythm: Normal rate and regular rhythm.     Heart sounds: No murmur.  Pulmonary:     Effort: Pulmonary effort is normal.     Breath sounds: Normal breath sounds. No wheezing, rhonchi or rales.  Neurological:     Mental Status: She is alert.  Psychiatric:        Mood and Affect: Mood normal.        Behavior: Behavior normal.    UC Treatments / Results  Labs (all labs ordered are listed, but only abnormal results are displayed) Labs Reviewed - No data to display  EKG   Radiology No results found.  Procedures Procedures (including critical care time)  Medications Ordered in UC Medications - No data to display  Initial Impression / Assessment and Plan / UC Course  I have reviewed the triage vital signs and the nursing notes.  Pertinent labs & imaging results that were available during my care of the patient were reviewed by me and considered in my medical decision making (see chart for details).    69 year old female presents with suspected dental infection.  Toradol as needed for pain.  No clinical trials of database reviewed.  Patient has chronic pain and was recently prescribed 180 oxycodone tablets earlier in December.  As a result, I will not be giving additional opiate pain medication.  Augmentin as prescribed.  Needs to see a dentist.  Final Clinical Impressions(s) / UC Diagnoses   Final diagnoses:  Dental infection  Canker  sore     Discharge Instructions     Continue your home pain medication.  Toradol for severe pain.  Antibiotic as prescribed.  See dentist.  If you worsen, go to the hospital.  Take care  Dr. Lacinda Axon     ED Prescriptions    Medication Sig Dispense  Auth. Provider   amoxicillin-clavulanate (AUGMENTIN) 875-125 MG tablet Take 1 tablet by mouth every 12 (twelve) hours. 20 tablet Ethanael Veith G, DO   ketorolac (TORADOL) 10 MG tablet Take 1 tablet (10 mg total) by mouth every 6 (six) hours as needed for moderate pain or severe pain. 12 tablet Thersa Salt G, DO     I have reviewed the PDMP during this encounter.   Coral Spikes, [place] 09/24/19 1108

## 2019-09-30 DIAGNOSIS — Z791 Long term (current) use of non-steroidal anti-inflammatories (NSAID): Secondary | ICD-10-CM | POA: Diagnosis not present

## 2019-09-30 DIAGNOSIS — M25571 Pain in right ankle and joints of right foot: Secondary | ICD-10-CM | POA: Diagnosis not present

## 2019-09-30 DIAGNOSIS — G8929 Other chronic pain: Secondary | ICD-10-CM | POA: Diagnosis not present

## 2019-09-30 DIAGNOSIS — Z0000 Encounter for general adult medical examination without abnormal findings: Secondary | ICD-10-CM | POA: Diagnosis not present

## 2019-09-30 DIAGNOSIS — Z87891 Personal history of nicotine dependence: Secondary | ICD-10-CM | POA: Diagnosis not present

## 2019-10-13 ENCOUNTER — Ambulatory Visit
Admission: RE | Admit: 2019-10-13 | Discharge: 2019-10-13 | Disposition: A | Discharge status: Home / Self Care | Payer: Medicare HMO | Source: Ambulatory Visit | Attending: Internal Medicine | Admitting: Internal Medicine

## 2019-10-13 DIAGNOSIS — Z1231 Encounter for screening mammogram for malignant neoplasm of breast: Secondary | ICD-10-CM | POA: Diagnosis not present

## 2019-10-14 ENCOUNTER — Other Ambulatory Visit: Payer: Self-pay

## 2019-10-14 ENCOUNTER — Other Ambulatory Visit: Payer: Self-pay | Admitting: Podiatry

## 2019-10-14 ENCOUNTER — Ambulatory Visit
Admission: RE | Admit: 2019-10-14 | Discharge: 2019-10-14 | Disposition: A | Discharge status: Home / Self Care | Payer: Medicare HMO | Source: Ambulatory Visit | Attending: Podiatry | Admitting: Podiatry

## 2019-10-14 DIAGNOSIS — M25571 Pain in right ankle and joints of right foot: Secondary | ICD-10-CM | POA: Diagnosis not present

## 2019-10-14 DIAGNOSIS — I8290 Acute embolism and thrombosis of unspecified vein: Secondary | ICD-10-CM

## 2019-10-14 DIAGNOSIS — M7989 Other specified soft tissue disorders: Secondary | ICD-10-CM | POA: Diagnosis not present

## 2019-10-14 DIAGNOSIS — M19071 Primary osteoarthritis, right ankle and foot: Secondary | ICD-10-CM | POA: Diagnosis not present

## 2019-10-14 DIAGNOSIS — I824Y1 Acute embolism and thrombosis of unspecified deep veins of right proximal lower extremity: Secondary | ICD-10-CM | POA: Diagnosis not present

## 2019-10-14 DIAGNOSIS — S93401A Sprain of unspecified ligament of right ankle, initial encounter: Secondary | ICD-10-CM | POA: Diagnosis not present

## 2019-10-19 DIAGNOSIS — M7989 Other specified soft tissue disorders: Secondary | ICD-10-CM | POA: Diagnosis not present

## 2019-10-19 DIAGNOSIS — M19071 Primary osteoarthritis, right ankle and foot: Secondary | ICD-10-CM | POA: Diagnosis not present

## 2019-10-19 DIAGNOSIS — R262 Difficulty in walking, not elsewhere classified: Secondary | ICD-10-CM | POA: Diagnosis not present

## 2019-10-19 DIAGNOSIS — S93401A Sprain of unspecified ligament of right ankle, initial encounter: Secondary | ICD-10-CM | POA: Diagnosis not present

## 2019-10-19 DIAGNOSIS — M25571 Pain in right ankle and joints of right foot: Secondary | ICD-10-CM | POA: Diagnosis not present

## 2019-12-09 IMAGING — CR DG CHEST 2V
1 series · 2 of 2 positions shown · non-contrast
Comparison: Report 11/11/2017

CLINICAL DATA: Short of breath

EXAM:
CHEST - 2 VIEW

[Series 1: dg chest 2 view · 0.14mm/px · 2 of 2 slices shown]
[im 1/2]
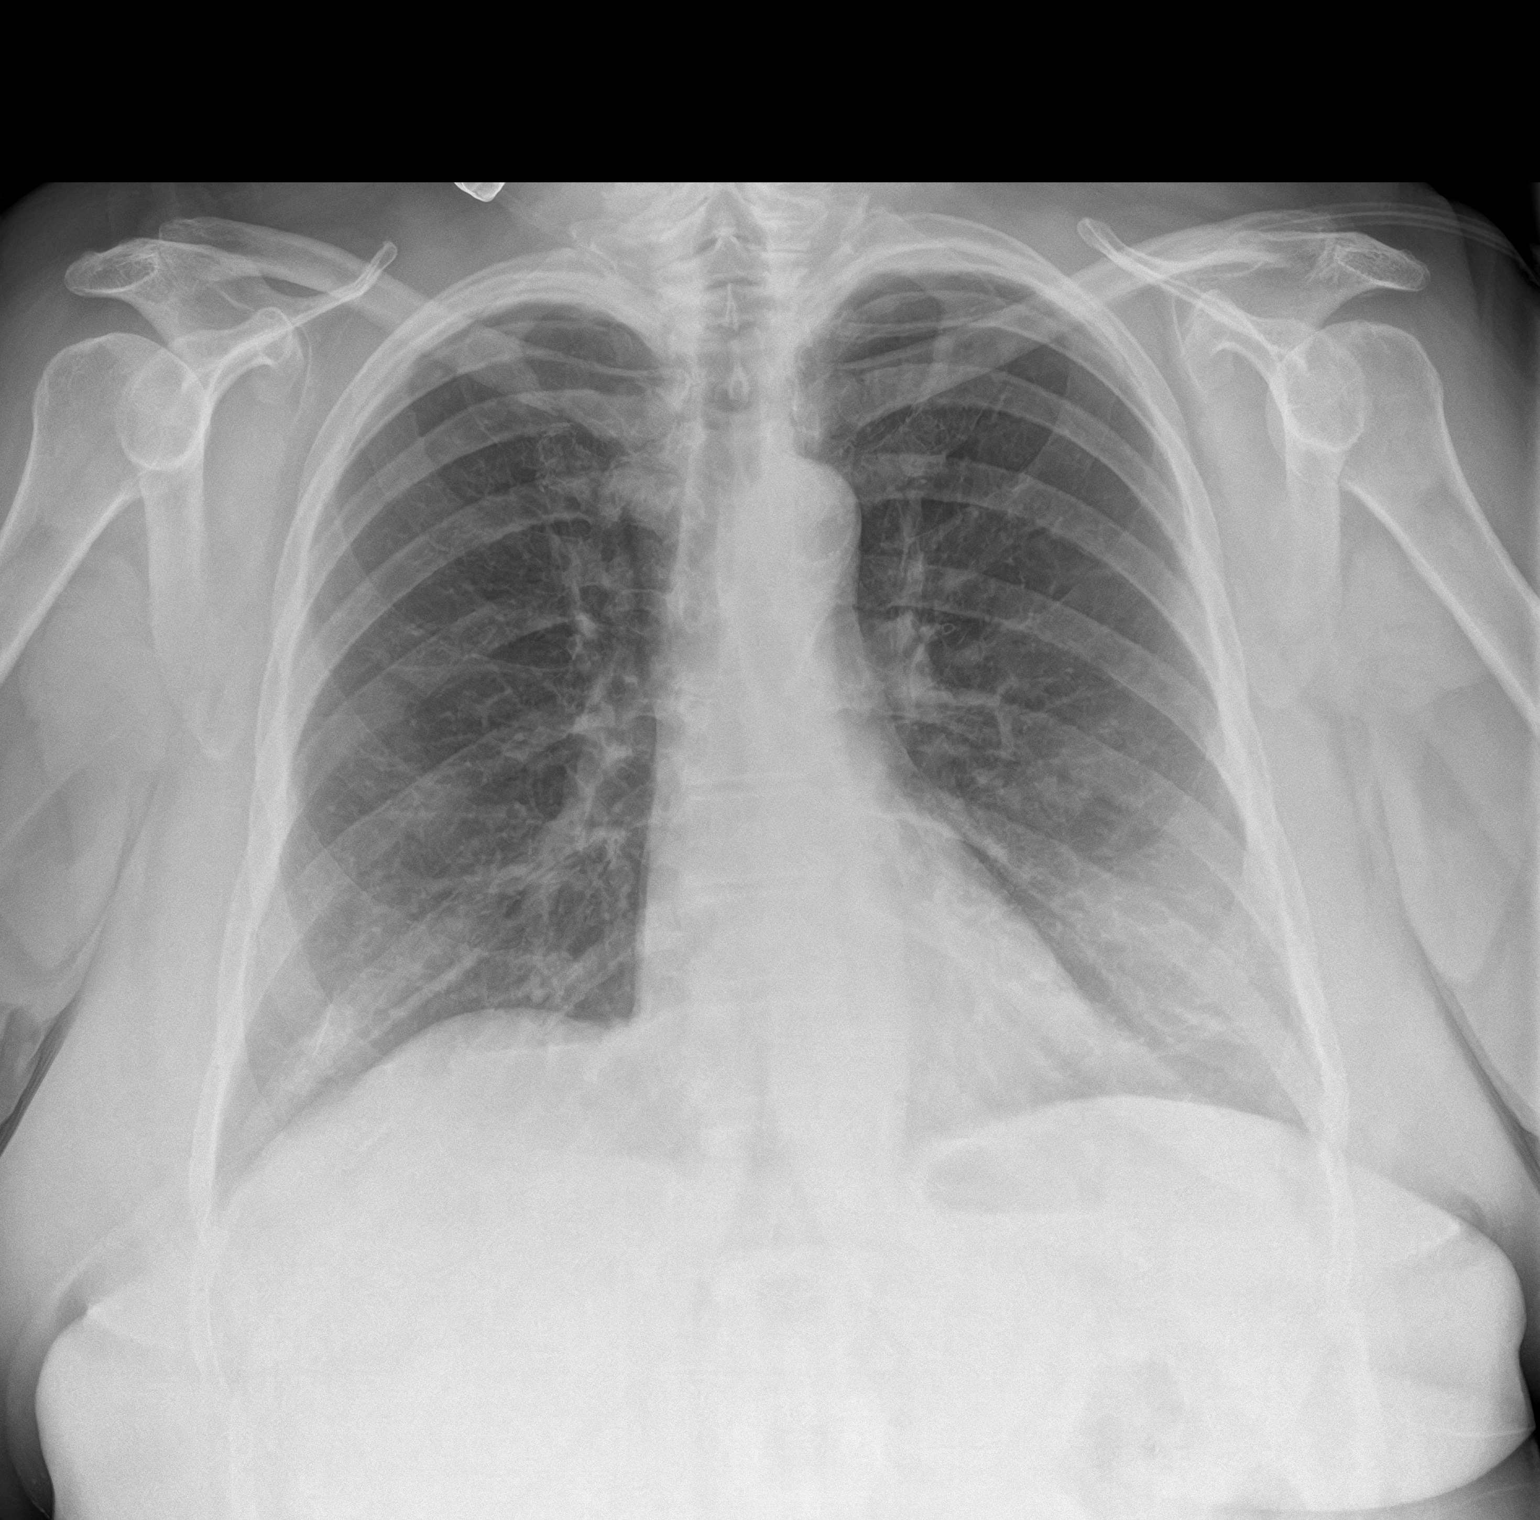
[im 2/2]
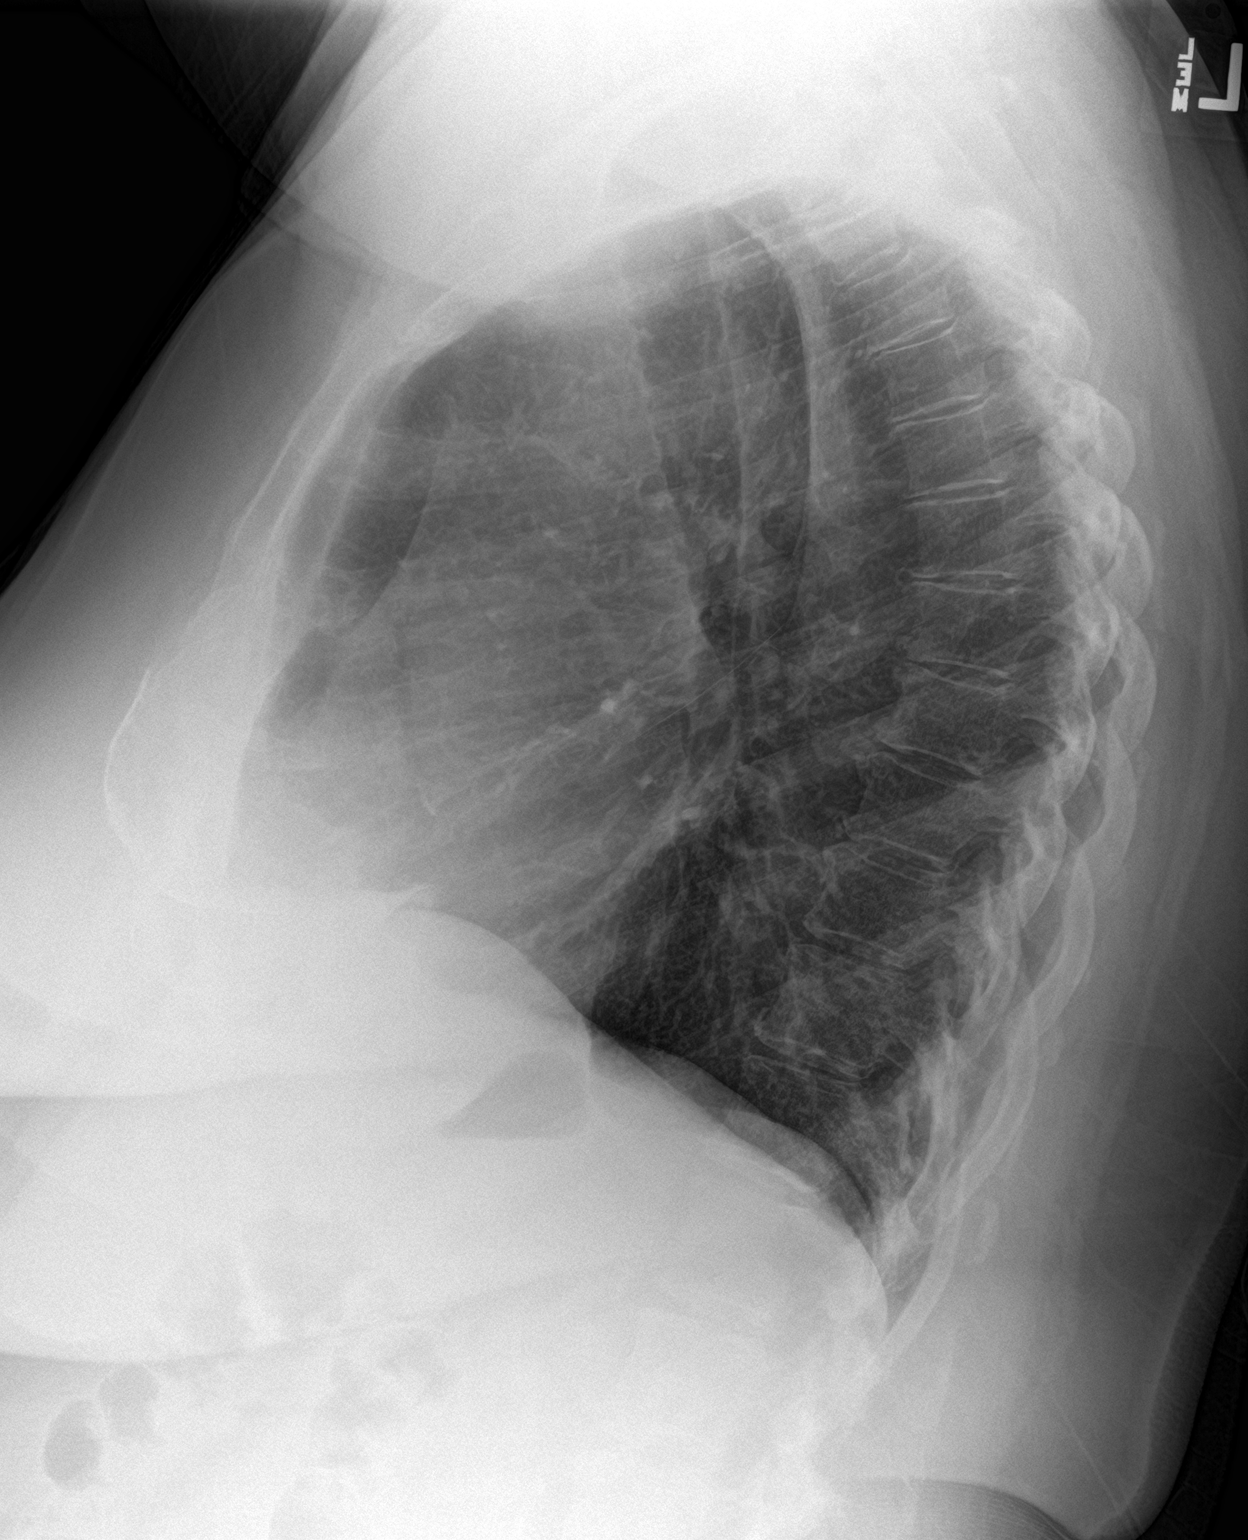

[2 of 2 positions shown; findings below may reference images not displayed]

FINDINGS: The heart size and mediastinal contours are within normal limits.
Both lungs are clear. Mild degenerative changes of the spine.
IMPRESSION: No active cardiopulmonary disease.

## 2020-03-23 DIAGNOSIS — E7800 Pure hypercholesterolemia, unspecified: Secondary | ICD-10-CM | POA: Diagnosis not present

## 2020-03-23 DIAGNOSIS — I10 Essential (primary) hypertension: Secondary | ICD-10-CM | POA: Diagnosis not present

## 2020-03-30 DIAGNOSIS — I10 Essential (primary) hypertension: Secondary | ICD-10-CM | POA: Diagnosis not present

## 2020-03-30 DIAGNOSIS — F411 Generalized anxiety disorder: Secondary | ICD-10-CM | POA: Diagnosis not present

## 2020-03-30 DIAGNOSIS — Z87891 Personal history of nicotine dependence: Secondary | ICD-10-CM | POA: Diagnosis not present

## 2020-03-30 DIAGNOSIS — F319 Bipolar disorder, unspecified: Secondary | ICD-10-CM | POA: Diagnosis not present

## 2020-03-30 DIAGNOSIS — Z0001 Encounter for general adult medical examination with abnormal findings: Secondary | ICD-10-CM | POA: Diagnosis not present

## 2020-03-30 DIAGNOSIS — E7800 Pure hypercholesterolemia, unspecified: Secondary | ICD-10-CM | POA: Diagnosis not present

## 2020-05-03 DIAGNOSIS — R0600 Dyspnea, unspecified: Secondary | ICD-10-CM | POA: Diagnosis not present

## 2020-05-03 DIAGNOSIS — E663 Overweight: Secondary | ICD-10-CM | POA: Diagnosis not present

## 2020-05-03 DIAGNOSIS — Z6841 Body Mass Index (BMI) 40.0 and over, adult: Secondary | ICD-10-CM | POA: Diagnosis not present

## 2020-05-03 DIAGNOSIS — J449 Chronic obstructive pulmonary disease, unspecified: Secondary | ICD-10-CM | POA: Diagnosis not present

## 2020-07-04 DIAGNOSIS — Z03818 Encounter for observation for suspected exposure to other biological agents ruled out: Secondary | ICD-10-CM | POA: Diagnosis not present

## 2020-07-04 DIAGNOSIS — R058 Other specified cough: Secondary | ICD-10-CM | POA: Diagnosis not present

## 2020-07-04 DIAGNOSIS — F319 Bipolar disorder, unspecified: Secondary | ICD-10-CM | POA: Diagnosis not present

## 2020-07-04 DIAGNOSIS — J40 Bronchitis, not specified as acute or chronic: Secondary | ICD-10-CM | POA: Diagnosis not present

## 2020-07-04 DIAGNOSIS — J449 Chronic obstructive pulmonary disease, unspecified: Secondary | ICD-10-CM | POA: Insufficient documentation

## 2020-07-04 DIAGNOSIS — I10 Essential (primary) hypertension: Secondary | ICD-10-CM | POA: Diagnosis not present

## 2020-09-07 ENCOUNTER — Other Ambulatory Visit: Payer: Self-pay | Admitting: Internal Medicine

## 2020-09-07 DIAGNOSIS — Z1231 Encounter for screening mammogram for malignant neoplasm of breast: Secondary | ICD-10-CM

## 2020-09-23 HISTORY — PX: CATARACT EXTRACTION: SUR2

## 2020-09-28 DIAGNOSIS — I10 Essential (primary) hypertension: Secondary | ICD-10-CM | POA: Diagnosis not present

## 2020-10-05 DIAGNOSIS — J449 Chronic obstructive pulmonary disease, unspecified: Secondary | ICD-10-CM | POA: Diagnosis not present

## 2020-10-05 DIAGNOSIS — I10 Essential (primary) hypertension: Secondary | ICD-10-CM | POA: Diagnosis not present

## 2020-10-05 DIAGNOSIS — J40 Bronchitis, not specified as acute or chronic: Secondary | ICD-10-CM | POA: Diagnosis not present

## 2020-10-05 DIAGNOSIS — F319 Bipolar disorder, unspecified: Secondary | ICD-10-CM | POA: Diagnosis not present

## 2020-10-05 DIAGNOSIS — Z0000 Encounter for general adult medical examination without abnormal findings: Secondary | ICD-10-CM | POA: Diagnosis not present

## 2020-10-19 ENCOUNTER — Other Ambulatory Visit: Payer: Self-pay

## 2020-10-19 ENCOUNTER — Ambulatory Visit
Admission: RE | Admit: 2020-10-19 | Discharge: 2020-10-19 | Disposition: A | Discharge status: Home / Self Care | Payer: Medicare HMO | Source: Ambulatory Visit | Attending: Internal Medicine | Admitting: Internal Medicine

## 2020-10-19 DIAGNOSIS — Z1231 Encounter for screening mammogram for malignant neoplasm of breast: Secondary | ICD-10-CM | POA: Diagnosis not present

## 2020-10-20 DIAGNOSIS — H2513 Age-related nuclear cataract, bilateral: Secondary | ICD-10-CM | POA: Diagnosis not present

## 2020-11-22 DIAGNOSIS — H25813 Combined forms of age-related cataract, bilateral: Secondary | ICD-10-CM | POA: Diagnosis not present

## 2021-01-04 DIAGNOSIS — M5030 Other cervical disc degeneration, unspecified cervical region: Secondary | ICD-10-CM | POA: Diagnosis not present

## 2021-02-15 DIAGNOSIS — H25811 Combined forms of age-related cataract, right eye: Secondary | ICD-10-CM | POA: Diagnosis not present

## 2021-02-15 DIAGNOSIS — I10 Essential (primary) hypertension: Secondary | ICD-10-CM | POA: Diagnosis not present

## 2021-02-24 IMAGING — US US EXTREM LOW VENOUS*R*
1 series · 14 of 24 positions shown · non-contrast
Comparison: None.

CLINICAL DATA: Right lower extremity swelling for 2 months.

EXAM:
RIGHT LOWER EXTREMITY VENOUS DOPPLER ULTRASOUND
TECHNIQUE: Gray-scale sonography with compression, as well as color and duplex
ultrasound, were performed to evaluate the deep venous system(s)
from the level of the common femoral vein through the popliteal and
proximal calf veins.

[Series 1: us extrem low venous*right* · 0.08mm/px · 14 of 33 slices shown]
[im 1/33]
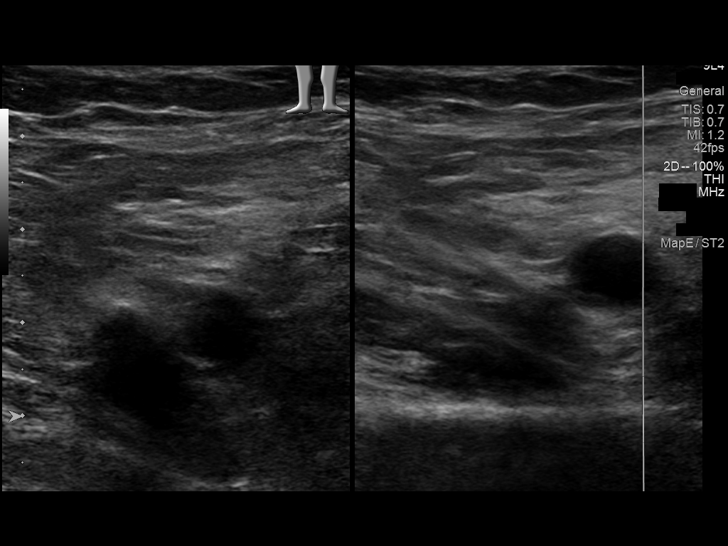
[im 3/33]
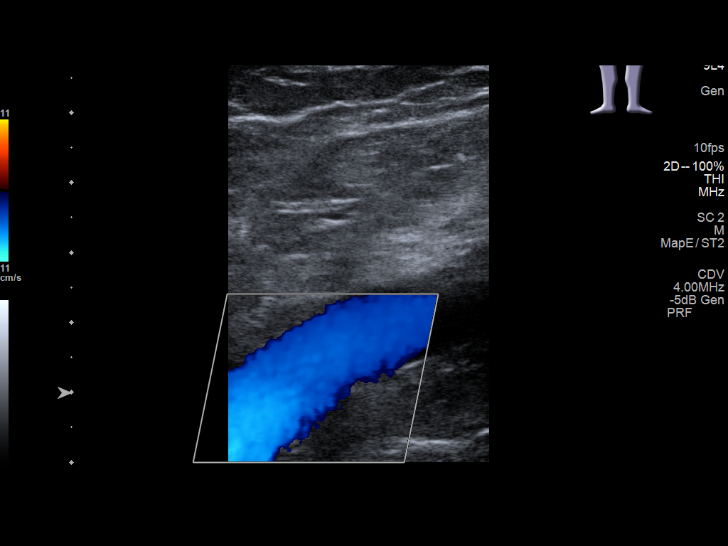
[im 6/33]
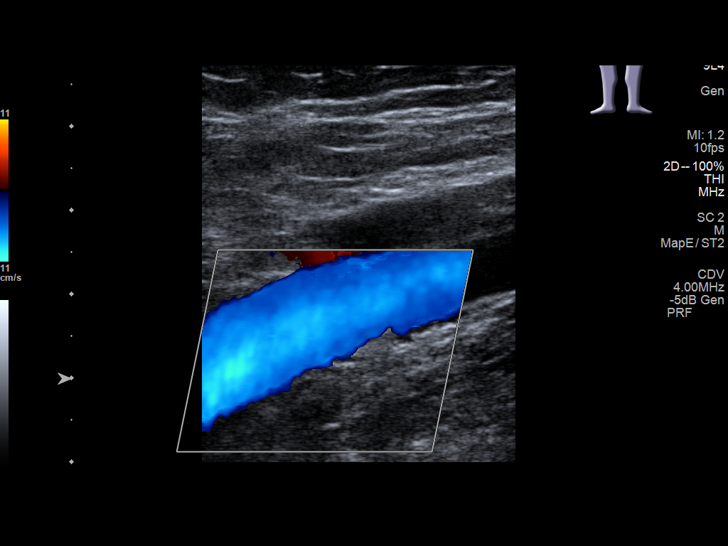
[im 9/33]
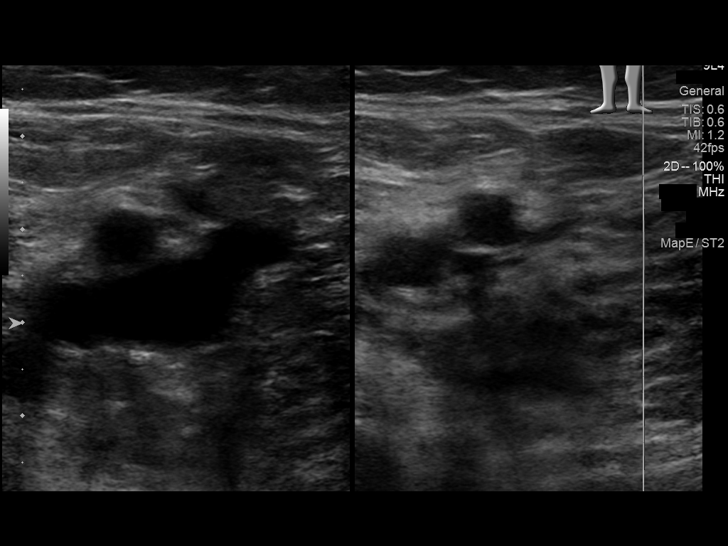
[im 10/33]
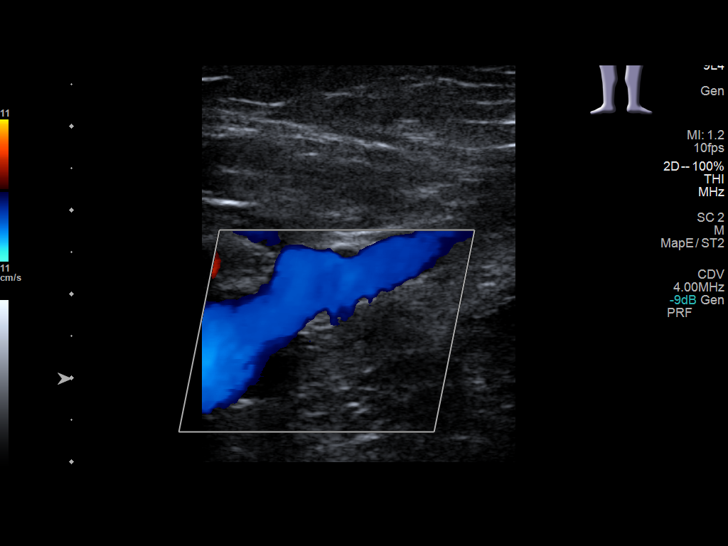
[im 13/33]
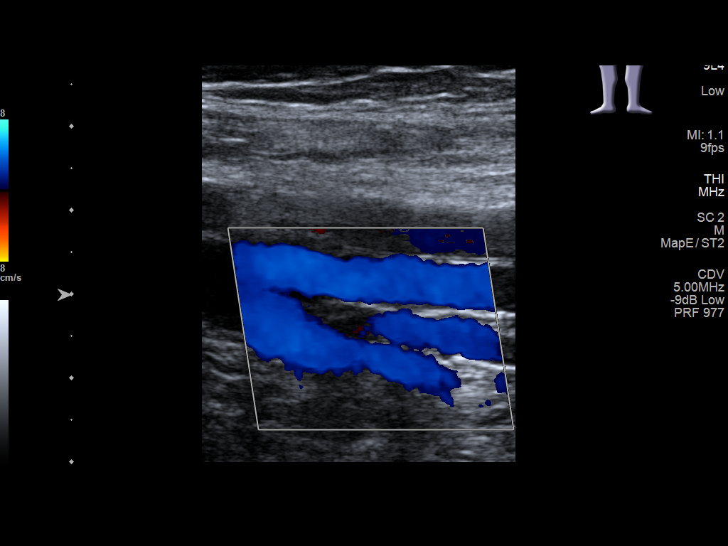
[im 16/33]
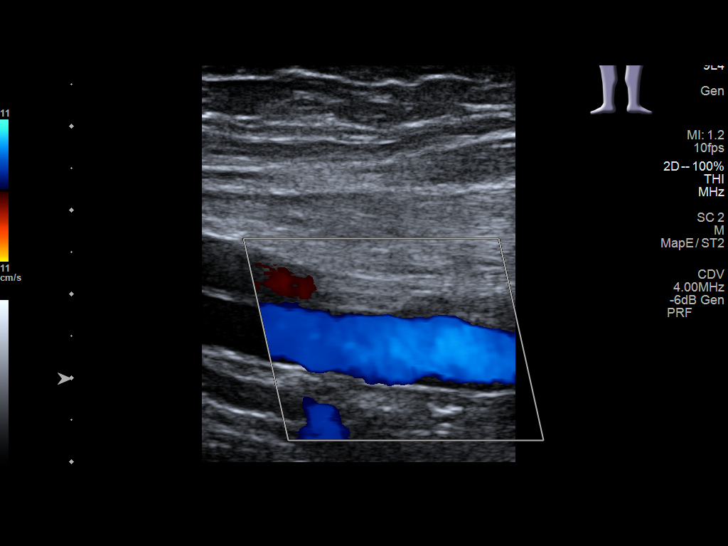
[im 17/33]
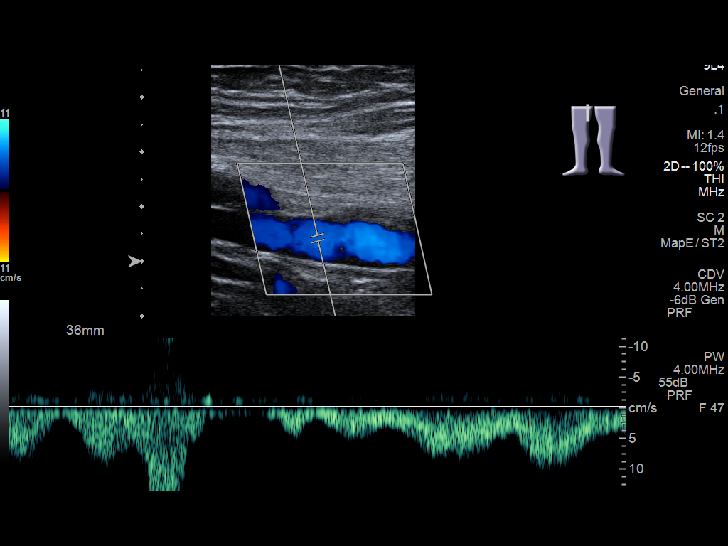
[im 20/33]
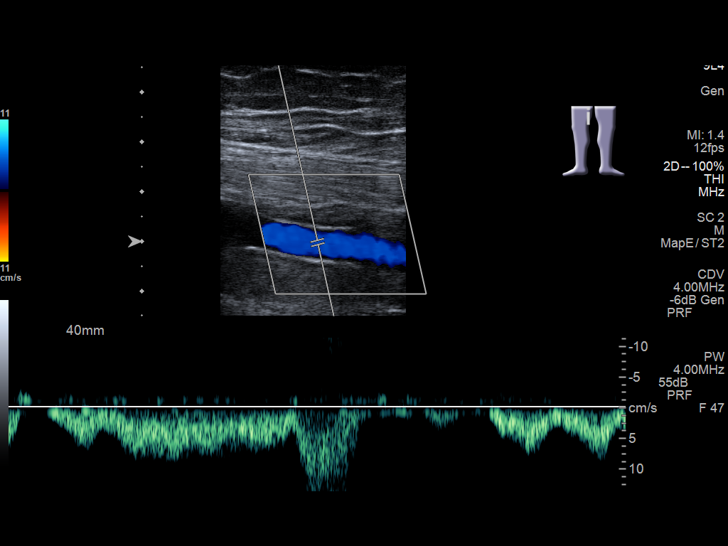
[im 23/33]
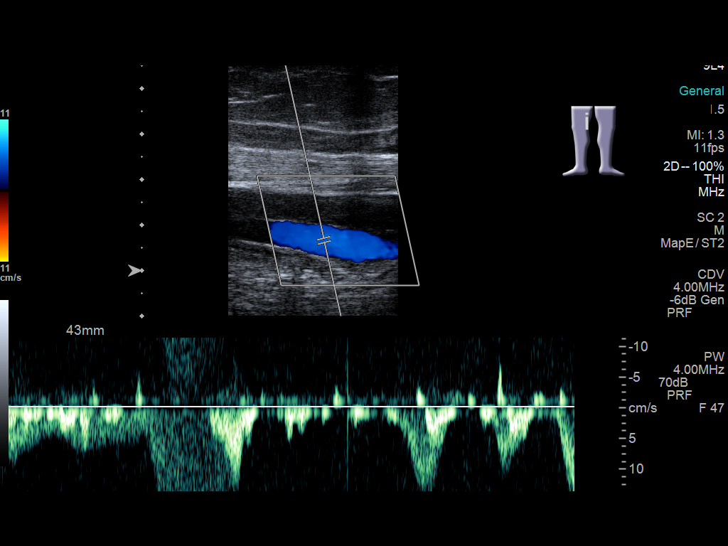
[im 26/33]
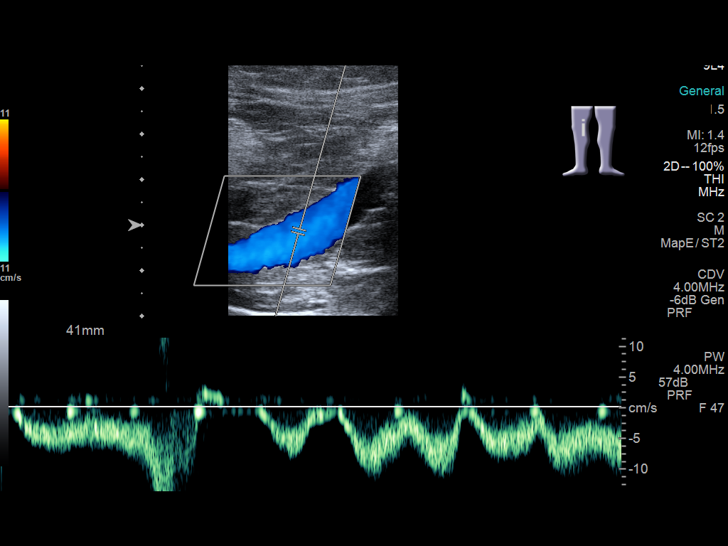
[im 27/33]
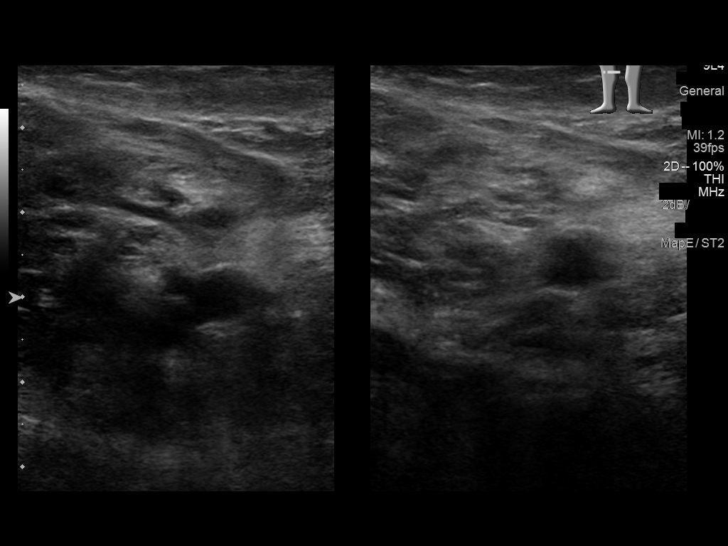
[im 30/33]
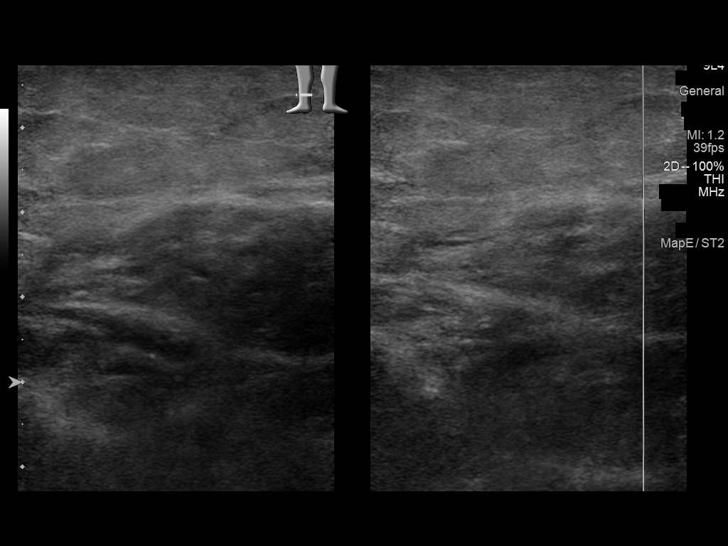
[im 33/33]
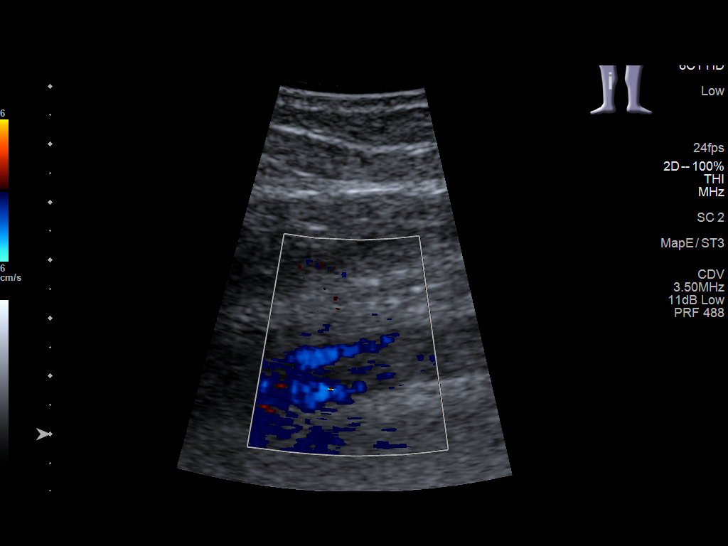

[14 of 24 positions shown; findings below may reference images not displayed]

FINDINGS: VENOUS

Normal compressibility of the common femoral, superficial femoral,
and popliteal veins, as well as the visualized calf veins.
Visualized portions of profunda femoral vein and great saphenous
vein unremarkable. No filling defects to suggest DVT on grayscale or
color Doppler imaging. Doppler waveforms show normal direction of
venous flow, normal respiratory phasicity and response to
augmentation.

Limited views of the contralateral common femoral vein are
unremarkable.

OTHER

None.

Limitations: none
IMPRESSION: No femoropopliteal DVT nor evidence of DVT within the visualized
calf veins in the right lower extremity.

If clinical symptoms are inconsistent or if there are persistent or
worsening symptoms, further imaging (possibly involving the iliac
veins) may be warranted.

## 2021-03-15 DIAGNOSIS — I10 Essential (primary) hypertension: Secondary | ICD-10-CM | POA: Diagnosis not present

## 2021-03-15 DIAGNOSIS — H25812 Combined forms of age-related cataract, left eye: Secondary | ICD-10-CM | POA: Diagnosis not present

## 2021-04-26 DIAGNOSIS — J449 Chronic obstructive pulmonary disease, unspecified: Secondary | ICD-10-CM | POA: Diagnosis not present

## 2021-04-26 DIAGNOSIS — J9811 Atelectasis: Secondary | ICD-10-CM | POA: Diagnosis not present

## 2021-04-26 DIAGNOSIS — R0600 Dyspnea, unspecified: Secondary | ICD-10-CM | POA: Diagnosis not present

## 2021-04-26 DIAGNOSIS — Z87891 Personal history of nicotine dependence: Secondary | ICD-10-CM | POA: Diagnosis not present

## 2021-04-26 DIAGNOSIS — R0609 Other forms of dyspnea: Secondary | ICD-10-CM | POA: Diagnosis not present

## 2021-05-03 DIAGNOSIS — I10 Essential (primary) hypertension: Secondary | ICD-10-CM | POA: Diagnosis not present

## 2021-05-10 DIAGNOSIS — Z0001 Encounter for general adult medical examination with abnormal findings: Secondary | ICD-10-CM | POA: Diagnosis not present

## 2021-05-10 DIAGNOSIS — Z6841 Body Mass Index (BMI) 40.0 and over, adult: Secondary | ICD-10-CM | POA: Diagnosis not present

## 2021-05-10 DIAGNOSIS — F319 Bipolar disorder, unspecified: Secondary | ICD-10-CM | POA: Diagnosis not present

## 2021-05-10 DIAGNOSIS — J449 Chronic obstructive pulmonary disease, unspecified: Secondary | ICD-10-CM | POA: Diagnosis not present

## 2021-05-10 DIAGNOSIS — I10 Essential (primary) hypertension: Secondary | ICD-10-CM | POA: Diagnosis not present

## 2021-05-10 DIAGNOSIS — E7800 Pure hypercholesterolemia, unspecified: Secondary | ICD-10-CM | POA: Diagnosis not present

## 2021-08-23 DIAGNOSIS — J449 Chronic obstructive pulmonary disease, unspecified: Secondary | ICD-10-CM | POA: Diagnosis not present

## 2021-08-23 DIAGNOSIS — I10 Essential (primary) hypertension: Secondary | ICD-10-CM | POA: Diagnosis not present

## 2021-08-23 DIAGNOSIS — M5030 Other cervical disc degeneration, unspecified cervical region: Secondary | ICD-10-CM | POA: Diagnosis not present

## 2021-08-23 DIAGNOSIS — F319 Bipolar disorder, unspecified: Secondary | ICD-10-CM | POA: Diagnosis not present

## 2021-09-17 ENCOUNTER — Emergency Department
Admission: EM | Admit: 2021-09-17 | Discharge: 2021-09-17 | Disposition: A | Discharge status: Home / Self Care | Payer: Medicare HMO | Attending: Emergency Medicine | Admitting: Emergency Medicine

## 2021-09-17 ENCOUNTER — Ambulatory Visit
Admission: EM | Admit: 2021-09-17 | Discharge: 2021-09-17 | Disposition: A | Discharge status: Home / Self Care | Payer: Medicare HMO

## 2021-09-17 ENCOUNTER — Other Ambulatory Visit: Payer: Self-pay

## 2021-09-17 ENCOUNTER — Encounter: Payer: Self-pay | Admitting: Emergency Medicine

## 2021-09-17 ENCOUNTER — Emergency Department: Payer: Medicare HMO

## 2021-09-17 DIAGNOSIS — R1905 Periumbilic swelling, mass or lump: Secondary | ICD-10-CM | POA: Diagnosis present

## 2021-09-17 DIAGNOSIS — J449 Chronic obstructive pulmonary disease, unspecified: Secondary | ICD-10-CM | POA: Diagnosis not present

## 2021-09-17 DIAGNOSIS — R109 Unspecified abdominal pain: Secondary | ICD-10-CM

## 2021-09-17 DIAGNOSIS — K420 Umbilical hernia with obstruction, without gangrene: Secondary | ICD-10-CM | POA: Diagnosis not present

## 2021-09-17 DIAGNOSIS — R0600 Dyspnea, unspecified: Secondary | ICD-10-CM

## 2021-09-17 DIAGNOSIS — R52 Pain, unspecified: Secondary | ICD-10-CM | POA: Diagnosis not present

## 2021-09-17 DIAGNOSIS — K5730 Diverticulosis of large intestine without perforation or abscess without bleeding: Secondary | ICD-10-CM | POA: Diagnosis not present

## 2021-09-17 DIAGNOSIS — D259 Leiomyoma of uterus, unspecified: Secondary | ICD-10-CM | POA: Diagnosis not present

## 2021-09-17 DIAGNOSIS — K429 Umbilical hernia without obstruction or gangrene: Secondary | ICD-10-CM | POA: Diagnosis not present

## 2021-09-17 DIAGNOSIS — Z79899 Other long term (current) drug therapy: Secondary | ICD-10-CM | POA: Insufficient documentation

## 2021-09-17 DIAGNOSIS — K449 Diaphragmatic hernia without obstruction or gangrene: Secondary | ICD-10-CM | POA: Diagnosis not present

## 2021-09-17 DIAGNOSIS — R1084 Generalized abdominal pain: Secondary | ICD-10-CM | POA: Diagnosis not present

## 2021-09-17 DIAGNOSIS — K439 Ventral hernia without obstruction or gangrene: Secondary | ICD-10-CM | POA: Diagnosis not present

## 2021-09-17 HISTORY — DX: Chronic obstructive pulmonary disease, unspecified: J44.9

## 2021-09-17 LAB — CBC
HCT: 45.5 % (ref 36.0–46.0)
Hemoglobin: 15 g/dL (ref 12.0–15.0)
MCH: 31.5 pg (ref 26.0–34.0)
MCHC: 33 g/dL (ref 30.0–36.0)
MCV: 95.6 fL (ref 80.0–100.0)
Platelets: 300 10*3/uL (ref 150–400)
RBC: 4.76 MIL/uL (ref 3.87–5.11)
RDW: 12.7 % (ref 11.5–15.5)
WBC: 13.7 10*3/uL — ABNORMAL HIGH (ref 4.0–10.5)
nRBC: 0 % (ref 0.0–0.2)

## 2021-09-17 LAB — COMPREHENSIVE METABOLIC PANEL
ALT: 16 U/L (ref 0–44)
AST: 15 U/L (ref 15–41)
Albumin: 3.8 g/dL (ref 3.5–5.0)
Alkaline Phosphatase: 62 U/L (ref 38–126)
Anion gap: 9 (ref 5–15)
BUN: 16 mg/dL (ref 8–23)
CO2: 26 mmol/L (ref 22–32)
Calcium: 9.1 mg/dL (ref 8.9–10.3)
Chloride: 95 mmol/L — ABNORMAL LOW (ref 98–111)
Creatinine, Ser: 0.64 mg/dL (ref 0.44–1.00)
GFR, Estimated: >60 mL/min (ref >60)
Glucose, Bld: 116 mg/dL — ABNORMAL HIGH (ref 70–99)
Potassium: 3.9 mmol/L (ref 3.5–5.1)
Sodium: 130 mmol/L — ABNORMAL LOW (ref 135–145)
Total Bilirubin: 0.8 mg/dL (ref 0.3–1.2)
Total Protein: 7.2 g/dL (ref 6.5–8.1)

## 2021-09-17 LAB — LIPASE, BLOOD: Lipase: 22 U/L (ref 11–51)

## 2021-09-17 MED ORDER — IOHEXOL 300 MG/ML  SOLN
100.0000 mL | Freq: Once | INTRAMUSCULAR | Status: AC | PRN
  Administered 2021-09-17: 20:00:00 100 mL via INTRAVENOUS

## 2021-09-17 MED ORDER — SODIUM CHLORIDE 0.9 % IV BOLUS
1000.0000 mL | Freq: Once | INTRAVENOUS | Status: AC
  Administered 2021-09-17: 20:00:00 1000 mL via INTRAVENOUS

## 2021-09-17 MED ORDER — ONDANSETRON HCL 4 MG/2ML IJ SOLN
4.0000 mg | Freq: Once | INTRAMUSCULAR | Status: AC
  Administered 2021-09-17: 20:00:00 4 mg via INTRAVENOUS
  Filled 2021-09-17: qty 2

## 2021-09-17 MED ORDER — MORPHINE SULFATE (PF) 4 MG/ML IV SOLN: 4.0000 mg | Freq: Once | INTRAVENOUS | Status: DC

## 2021-09-17 MED ORDER — HYDROMORPHONE HCL 1 MG/ML IJ SOLN
1.0000 mg | Freq: Once | INTRAMUSCULAR | Status: AC
  Administered 2021-09-17: 20:00:00 1 mg via INTRAVENOUS
  Filled 2021-09-17: qty 1

## 2021-09-17 MED ORDER — ACETAMINOPHEN 325 MG PO TABS
650.0000 mg | ORAL_TABLET | Freq: Once | ORAL | Status: AC
  Administered 2021-09-17: 18:00:00 650 mg via ORAL
  Filled 2021-09-17: qty 2

## 2021-09-17 NOTE — ED Notes (Signed)
Pt oxygen saturations 88% on arrival to tx room. Placed pt on 2L [place] with improvement to 94%. Pt states her oxygen goal is 88-90. Requesting breathing tx at this time.

## 2021-09-17 NOTE — Discharge Instructions (Addendum)
Avoid bending over or lifting more than 5 pounds at a time.

## 2021-09-17 NOTE — ED Notes (Addendum)
Attempted to place IV for IV fluids and medication, however patient texting on phone. Patient requested RN to come back in 5-10 minutes as she is busy texting.

## 2021-09-17 NOTE — ED Triage Notes (Signed)
Patient is here for "Abd Pain". Started with "Cough on Wednesday & now something is sticking out from Abd area". Now Abd pain "is bad" (lower abd area). ? Hernia (per patient). No fever. History of COPD and SOB.

## 2021-09-17 NOTE — ED Triage Notes (Signed)
Presents via EMS from UC  pt is having RLQ pain  area tender to touch  decreased appetite and some constipation for couple days

## 2021-09-17 NOTE — ED Notes (Signed)
Patient is being discharged from the Urgent Care and sent to the Emergency Department via EMS . Per Lillia Carmel NP, patient is in need of higher level of care due to Abd pain, SOB. Patient is aware and verbalizes understanding of plan of care.  Vitals:   09/17/21 1236 09/17/21 1241  BP: (!) 143/81   Pulse: (!) 113   Resp: (!) 48 (!) 40  Temp: (!) 97.4 F (36.3 C)   SpO2: 92% 93%

## 2021-09-17 NOTE — ED Triage Notes (Signed)
No dysuria. Stools "its been a while".

## 2021-09-17 NOTE — ED Provider Notes (Signed)
[facility] Emergency Department Provider Note  ____________________________________________  Time seen: Approximately 7:22 PM  I have reviewed the triage vital signs and the nursing notes.   HISTORY  Chief Complaint Abdominal Pain (/)    HPI Marissa Hayden is a 70 y.o. female with a history of COPD who comes ED complaining of bulging mass at the umbilicus for the past 3 days.  Sudden onset.  Moderately painful.  Pain is nonradiating, no aggravating or alleviating factors, constant.  She is also had constipation for the last 2 days.  No vomiting.  No fever.    Past Medical History:  Diagnosis Date   COPD (chronic obstructive pulmonary disease) ([place])      There are no problems to display for this patient.    Past Surgical History:  Procedure Laterality Date   AUGMENTATION MAMMAPLASTY Bilateral    over 30  years ago     Prior to Admission medications   Medication Sig Start Date End Date Taking? Authorizing Provider  albuterol (PROVENTIL HFA;VENTOLIN HFA) 108 (90 Base) MCG/ACT inhaler Inhale 2 puffs into the lungs every 6 (six) hours as needed for wheezing or shortness of breath. 07/28/18   Merlyn Lot, MD  amoxicillin-clavulanate (AUGMENTIN) 875-125 MG tablet Take 1 tablet by mouth every 12 (twelve) hours. 09/24/19   Coral Spikes, DO  cholestyramine light (PREVALITE) 4 GM/DOSE powder SMARTSIG:1 Scoopful By Mouth Twice Daily 05/19/19   [provider]  citalopram (CELEXA) 20 MG tablet TAKE 1 TABLET EVERY DAY 12/04/18   [provider]  ipratropium-albuterol (DUONEB) 0.5-2.5 (3) MG/3ML SOLN Take 3 mLs by nebulization every 6 (six) hours as needed. 07/28/18   Merlyn Lot, MD  ketorolac (TORADOL) 10 MG tablet Take 1 tablet (10 mg total) by mouth every 6 (six) hours as needed for moderate pain or severe pain. 09/24/19   Coral Spikes, DO  lisinopril (ZESTRIL) 10 MG tablet TAKE 1 TABLET EVERY DAY 04/02/19   [provider]  oxybutynin (DITROPAN) 5 MG tablet TAKE 1 TABLET TWICE DAILY 09/23/19   [provider]  oxyCODONE-acetaminophen (PERCOCET/ROXICET) 5-325 MG tablet TAKE ONE AND ONE-HALF TABLET BY MOUTH EVERY 6 HOURS AS NEEDED FOR PAIN 09/03/19   [provider]  simvastatin (ZOCOR) 40 MG tablet TAKE 1 TABLET EVERY DAY 04/02/19   [provider]     Allergies Patient has no known allergies.   Family History  Problem Relation Age of Onset   Breast cancer Sister 76    Social History Social History   Tobacco Use   Smoking status: Former   Smokeless tobacco: Never  Scientific laboratory technician Use: Never used  Substance Use Topics   Alcohol use: Not Currently   Drug use: Not Currently    Review of Systems  Constitutional:   No fever or chills.  ENT:   No sore throat. No rhinorrhea. Cardiovascular:   No chest pain or syncope. Respiratory:   No dyspnea or cough. Gastrointestinal:   Positive as above for abdominal pain and constipation Musculoskeletal:   Negative for focal pain or swelling All other systems reviewed and are negative except as documented above in ROS and HPI.  ____________________________________________   PHYSICAL EXAM:  VITAL SIGNS: ED Triage Vitals  Enc Vitals Group     BP 09/17/21 1413 121/66     Pulse Rate 09/17/21 1413 96     Resp 09/17/21 1413 16     Temp 09/17/21 1413 98.5 F (36.9 C)  Temp Source 09/17/21 1413 Oral     SpO2 09/17/21 1413 91 %     Weight 09/17/21 1349 244 lb 14.9 oz (111.1 kg)     Height 09/17/21 1349 5' 1.5" (1.562 m)     Head Circumference --      Peak Flow --      Pain Score --      Pain Loc --      Pain Edu? --      Excl. in [place]? --     Vital signs reviewed, nursing assessments reviewed.   Constitutional:   Alert and oriented. Non-toxic appearance. Eyes:   Conjunctivae are normal. EOMI. PERRL. ENT      Head:   Normocephalic and atraumatic.      Nose:   Wearing a mask.      Mouth/Throat:    Wearing a mask.      Neck:   No meningismus. Full ROM. Hematological/Lymphatic/Immunilogical:   No cervical lymphadenopathy. Cardiovascular:   RRR. Symmetric bilateral radial and DP pulses.  No murmurs. Cap refill less than 2 seconds. Respiratory:   Normal respiratory effort without tachypnea/retractions. Breath sounds are clear and equal bilaterally. No wheezes/rales/rhonchi. Gastrointestinal:   Soft with firm umbilical mass consistent with hernia.  Very tender to the touch, patient is not able to tolerate manual reduction on initial exam.. Non distended.  No rebound, rigidity, or guarding. Genitourinary:   deferred Musculoskeletal:   Normal range of motion in all extremities. No joint effusions.  No lower extremity tenderness.  No edema. Neurologic:   Normal speech and language.  Motor grossly intact. No acute focal neurologic deficits are appreciated.  Skin:    Skin is warm, dry and intact. No rash noted.  No petechiae, purpura, or bullae.  ____________________________________________    LABS (pertinent positives/negatives) (all labs ordered are listed, but only abnormal results are displayed) Labs Reviewed  COMPREHENSIVE METABOLIC PANEL - Abnormal; Notable for the following components:      Result Value   Sodium 130 (*)    Chloride 95 (*)    Glucose, Bld 116 (*)    All other components within normal limits  CBC - Abnormal; Notable for the following components:   WBC 13.7 (*)    All other components within normal limits  LIPASE, BLOOD   ____________________________________________   EKG    ____________________________________________    RADIOLOGY  CT ABDOMEN PELVIS W CONTRAST  Result Date: 09/17/2021 CLINICAL DATA:  Ventral hernia, bowel obstruction suspected. EXAM: CT ABDOMEN AND PELVIS WITH CONTRAST TECHNIQUE: Multidetector CT imaging of the abdomen and pelvis was performed using the standard protocol following bolus administration of intravenous contrast. CONTRAST:   1102mL OMNIPAQUE IOHEXOL 300 MG/ML  SOLN COMPARISON:  None. FINDINGS: Lower chest: No acute abnormality. Partially calcified bilateral collections reflecting ruptured silicone implants seen on prior mammography. Hepatobiliary: No suspicious hepatic lesion. Vicarious secretion of contrast into the gallbladder. No biliary ductal dilation or evidence of acute gallbladder inflammation. Pancreas: No pancreatic ductal dilation or evidence of acute inflammation. Spleen: Normal size spleen. Adrenals/Urinary Tract: Bilateral adrenal glands appear normal. No hydronephrosis. No solid enhancing renal mass. Urinary bladder is unremarkable for degree of distension. Stomach/Bowel: No enteric contrast was administered. Tiny hiatal hernia. Stomach is predominantly decompressed limiting evaluation. No pathologic dilation of small or large bowel. The appendix and terminal ileum appear normal. Scattered sigmoid colonic diverticulosis without findings of acute diverticulitis. Mild inflammation within a large ventral hernia containing fat and nonobstructed portion of colon with a 5.3 cm  aperture width. Vascular/Lymphatic: Aortic and branch vessel atherosclerosis without abdominal aortic aneurysm. No pathologically enlarged abdominal or pelvic lymph nodes. Reproductive: Calcified uterine leiomyomas. No suspicious adnexal masses. Other: No significant abdominopelvic free fluid. Musculoskeletal: Thoracolumbar spondylosis. No acute osseous abnormality. IMPRESSION: 1. Mild inflammation within a large ventral hernia containing fat and nonobstructed portion of colon with a 5.3 cm aperture, recommend clinical correlation for reducibility. 2. Sigmoid colonic diverticulosis without findings of acute diverticulitis. 3. Calcified uterine leiomyomas. 4.  Aortic Atherosclerosis (ICD10-I70.0). Electronically Signed   By: Dahlia Bailiff M.D.   On: 09/17/2021 20:17    ____________________________________________   PROCEDURES Hernia  reduction  Date/Time: 09/17/2021 11:04 PM Performed by: Carrie Mew, MD Authorized by: Carrie Mew, MD  Consent: Verbal consent obtained. Risks and benefits: risks, benefits and alternatives were discussed Consent given by: patient Imaging studies: imaging studies available Patient identity confirmed: verbally with patient Local anesthesia used: no  Anesthesia: Local anesthesia used: no  Sedation: Patient sedated: no  Patient tolerance: patient tolerated the procedure well with no immediate complications Comments: Successful reduction of umbilical hernia with patient in Trendelenburg position with immediate resolution of pain.    ____________________________________________  DIFFERENTIAL DIAGNOSIS   Umbilical hernia with possible incarceration, bowel obstruction  CLINICAL IMPRESSION / ASSESSMENT AND PLAN / ED COURSE  Medications ordered in the ED: Medications  acetaminophen (TYLENOL) tablet 650 mg (650 mg Oral Given 09/17/21 1737)  sodium chloride 0.9 % bolus 1,000 mL (1,000 mLs Intravenous New Bag/Given 09/17/21 2003)  ondansetron (ZOFRAN) injection 4 mg (4 mg Intravenous Given 09/17/21 2005)  HYDROmorphone (DILAUDID) injection 1 mg (1 mg Intravenous Given 09/17/21 2007)  iohexol (OMNIPAQUE) 300 MG/ML solution 100 mL (100 mLs Intravenous Contrast Given 09/17/21 1953)    Pertinent labs & imaging results that were available during my care of the patient were reviewed by me and considered in my medical decision making (see chart for details).  Marissa Hayden was evaluated in Emergency Department on 09/17/2021 for the symptoms described in the history of present illness. She was evaluated in the context of the global COVID-19 pandemic, which necessitated consideration that the patient might be at risk for infection with the SARS-CoV-2 virus that causes COVID-19. Institutional protocols and algorithms that pertain to the evaluation of patients at risk for  COVID-19 are in a state of rapid change based on information released by regulatory bodies including the CDC and federal and state organizations. These policies and algorithms were followed during the patient's care in the ED.   Patient presents with clinically apparent umbilical hernia.  Not reducible initially.  Will give IV Dilaudid, IV fluids for hydration, obtain CT scan to evaluate for complications  Clinical Course as of 09/17/21 2304  Mon Sep 17, 2021  2235 CT scan shows 1 cm ventral hernia with fat and large intestine.  No radiographic signs of strangulation or bowel obstruction.  I was able to reduce this at the bedside with immediate resolution of her pain.  At this point she is stable for discharge home to follow-up with general surgery in clinic.  Patient is agreeable with this plan. [PS]    Clinical Course User Index [PS] Carrie Mew, MD     ____________________________________________   FINAL CLINICAL IMPRESSION(S) / ED DIAGNOSES    Final diagnoses:  Umbilical hernia without obstruction and without gangrene     ED Discharge Orders     None       Portions of this note were generated with dragon dictation software. Dictation  errors may occur despite best attempts at proofreading.    Carrie Mew, MD 09/17/21 [phone]

## 2021-09-17 NOTE — ED Provider Notes (Signed)
[facility]    CSN: [id number] Arrival date & time: 09/17/21  1228      History   Chief Complaint Chief Complaint  Patient presents with   Abdominal Pain    HPI Marissa Hayden is a 70 y.o. female.   HPI  70 year old female here for evaluation of abdominal pain.  Patient ports that she started coughing on Wednesday, she has a history of COPD, and she coughed hard which caused a protrusion from her bellybutton.  At the same time she also felt a pull in the right lower quadrant of her abdomen that has been constant ever since.  She is rating her pain as a 10/10.  She states that she had 1 episode of nausea and vomiting when the pain first occurred but she is had no further nausea vomiting since then.  She also states that she has not a bowel movement since then.  Patient is quite dyspneic in the exam room and has difficulty speaking in full sentences.  Past Medical History:  Diagnosis Date   COPD (chronic obstructive pulmonary disease) ([place])     There are no problems to display for this patient.   Past Surgical History:  Procedure Laterality Date   AUGMENTATION MAMMAPLASTY Bilateral    over 30  years ago    OB History   No obstetric history on file.      Home Medications    Prior to Admission medications   Medication Sig Start Date End Date Taking? Authorizing Provider  albuterol (PROVENTIL HFA;VENTOLIN HFA) 108 (90 Base) MCG/ACT inhaler Inhale 2 puffs into the lungs every 6 (six) hours as needed for wheezing or shortness of breath. 07/28/18  Yes Merlyn Lot, MD  cholestyramine light (PREVALITE) 4 GM/DOSE powder SMARTSIG:1 Scoopful By Mouth Twice Daily 05/19/19  Yes [provider]  citalopram (CELEXA) 20 MG tablet TAKE 1 TABLET EVERY DAY 12/04/18  Yes [provider]  ipratropium-albuterol (DUONEB) 0.5-2.5 (3) MG/3ML SOLN Take 3 mLs by nebulization every 6 (six) hours as needed. 07/28/18  Yes Merlyn Lot, MD  lisinopril  (ZESTRIL) 10 MG tablet TAKE 1 TABLET EVERY DAY 04/02/19  Yes [provider]  oxybutynin (DITROPAN) 5 MG tablet TAKE 1 TABLET TWICE DAILY 09/23/19  Yes [provider]  oxyCODONE-acetaminophen (PERCOCET/ROXICET) 5-325 MG tablet TAKE ONE AND ONE-HALF TABLET BY MOUTH EVERY 6 HOURS AS NEEDED FOR PAIN 09/03/19  Yes [provider]  simvastatin (ZOCOR) 40 MG tablet TAKE 1 TABLET EVERY DAY 04/02/19  Yes [provider]  amoxicillin-clavulanate (AUGMENTIN) 875-125 MG tablet Take 1 tablet by mouth every 12 (twelve) hours. 09/24/19   Coral Spikes, DO  ketorolac (TORADOL) 10 MG tablet Take 1 tablet (10 mg total) by mouth every 6 (six) hours as needed for moderate pain or severe pain. 09/24/19   Coral Spikes, DO    Family History Family History  Problem Relation Age of Onset   Breast cancer Sister 34    Social History Social History   Tobacco Use   Smoking status: Former   Smokeless tobacco: Never  Scientific laboratory technician Use: Never used  Substance Use Topics   Alcohol use: Not Currently   Drug use: Not Currently     Allergies   Patient has no known allergies.   Review of Systems Review of Systems  Constitutional:  Negative for activity change and fever.  Respiratory:  Positive for cough, shortness of breath and wheezing.   Gastrointestinal:  Positive for abdominal  distention, abdominal pain and constipation.  Genitourinary:  Negative for dysuria, frequency and urgency.    Physical Exam Triage Vital Signs ED Triage Vitals  Enc Vitals Group     BP 09/17/21 1236 (!) 143/81     Pulse Rate 09/17/21 1236 (!) 113     Resp 09/17/21 1236 (!) 48     Temp 09/17/21 1236 (!) 97.4 F (36.3 C)     Temp Source 09/17/21 1236 Oral     SpO2 09/17/21 1236 92 %     Weight --      Height --      Head Circumference --      Peak Flow --      Pain Score 09/17/21 1242 10     Pain Loc --      Pain Edu? --      Excl. in [place]? --    No data found.  Updated Vital  Signs BP (!) 143/81    Pulse (!) 113    Temp (!) 97.4 F (36.3 C) (Oral)    Resp (!) 40    SpO2 93%   Visual Acuity Right Eye Distance:   Left Eye Distance:   Bilateral Distance:    Right Eye Near:   Left Eye Near:    Bilateral Near:     Physical Exam Vitals and nursing note reviewed.  Constitutional:      General: She is in acute distress.     Appearance: Normal appearance. She is ill-appearing.  HENT:     Head: Normocephalic and atraumatic.  Cardiovascular:     Rate and Rhythm: Regular rhythm. Tachycardia present.     Pulses: Normal pulses.     Heart sounds: Normal heart sounds. No murmur heard.   No gallop.  Pulmonary:     Effort: Respiratory distress present.     Breath sounds: Wheezing present. No rales.  Abdominal:     General: There is distension.     Tenderness: There is abdominal tenderness.  Skin:    General: Skin is warm and dry.     Capillary Refill: Capillary refill takes less than 2 seconds.  Neurological:     General: No focal deficit present.     Mental Status: She is alert and oriented to person, place, and time.  Psychiatric:        Mood and Affect: Mood normal.        Behavior: Behavior normal.        Thought Content: Thought content normal.        Judgment: Judgment normal.     UC Treatments / Results  Labs (all labs ordered are listed, but only abnormal results are displayed) Labs Reviewed  URINALYSIS, COMPLETE (UACMP) WITH MICROSCOPIC    EKG   Radiology No results found.  Procedures Procedures (including critical care time)  Medications Ordered in UC Medications - No data to display  Initial Impression / Assessment and Plan / UC Course  I have reviewed the triage vital signs and the nursing notes.  Pertinent labs & imaging results that were available during my care of the patient were reviewed by me and considered in my medical decision making (see chart for details).  Patient is an ill-appearing 69 year old female here for  evaluation of abdominal pain and shortness of breath.  She states that her breathing is fast and causing dyspnea because she is having pain and she is anxious about being in the urgent care.  Her prime reason for being here is  abdominal pain that is been present for the last 5 days.  She reports that she has COPD and she had a coughing spell that led to a protrusion from the middle of her abdomen as well as a pulling sensation in the right lower quadrant of her abdomen.  The pulling sensation is still present and has been constant.  The protrusion from her abdomen has also been constant.  She is rating her pain as a 10/10.  She denies any fever, nausea, or vomiting, save for when it initially happened to cause her to vomit.  She also denies any urinary complaints.  Patient is quite dyspneic and having trouble speaking in full sentences.  Her lung sounds reveal decreased lung sounds diffusely with scattered expiratory wheezes.  Her abdomen is protuberant with a centralized protrusion from her umbilicus that is hard, tender, and not reducible.  Patient also has pain in the right lower quadrant of her abdomen underneath her pannus.  I cannot get a good exam due to the amount of discomfort that the patient is in.  Because of her respiratory distress and also abdominal pain and the concern for possible incarcerated umbilical hernia I discussed the patient that we need to send her to the emergency department by ambulance.  Patient is agreeable.  Report called to Delsa Sale the charge nurse at [facility] emergency department.  Report given to J. Crawford the paramedic with [facility].  Care transferred.   Final Clinical Impressions(s) / UC Diagnoses   Final diagnoses:  Dyspnea, unspecified type  Abdominal pain, unspecified abdominal location  Incarcerated umbilical hernia     Discharge Instructions      Please go to [facility] for evaluation of your abdominal pain, hernia, and  respiratory symptoms.     ED Prescriptions   None    PDMP not reviewed this encounter.   Margarette Canada, NP 09/17/21 [phone]

## 2021-09-17 NOTE — Discharge Instructions (Addendum)
Please go to [facility] for evaluation of your abdominal pain, hernia, and respiratory symptoms.

## 2021-09-21 ENCOUNTER — Other Ambulatory Visit: Payer: Self-pay | Admitting: Internal Medicine

## 2021-09-21 DIAGNOSIS — Z1231 Encounter for screening mammogram for malignant neoplasm of breast: Secondary | ICD-10-CM

## 2021-10-03 ENCOUNTER — Other Ambulatory Visit: Payer: Self-pay

## 2021-10-03 ENCOUNTER — Ambulatory Visit
Admission: RE | Admit: 2021-10-03 | Discharge: 2021-10-03 | Disposition: A | Discharge status: Home / Self Care | Payer: Medicare HMO | Source: Ambulatory Visit | Attending: Physician Assistant | Admitting: Physician Assistant

## 2021-10-03 VITALS — BP 132/76 | HR 100 | Temp 98.1°F | Resp 18 | Ht 61.2 in | Wt 263.0 lb

## 2021-10-03 DIAGNOSIS — M79671 Pain in right foot: Secondary | ICD-10-CM | POA: Diagnosis not present

## 2021-10-03 DIAGNOSIS — M7989 Other specified soft tissue disorders: Secondary | ICD-10-CM | POA: Diagnosis not present

## 2021-10-03 MED ORDER — PREDNISONE 10 MG PO TABS: ORAL_TABLET | ORAL | 0 refills | Status: DC

## 2021-10-03 NOTE — ED Provider Notes (Signed)
[facility]    CSN: [id number] Arrival date & time: 10/03/21  1247      History   Chief Complaint Chief Complaint  Patient presents with   Appointment   Foot Pain    HPI Marissa Hayden is a 71 y.o. female with history of COPD and chronic pain.  Patient presents today for right foot pain, swelling and redness for the past several days.  She says she has significant arthritis in her feet and has had symptoms like this before.  She said the last time she had symptoms like this she was given prednisone which help with swelling and pain.  Patient takes oxycodone 4 times daily for chronic pain.  She says the pain in the foot is not that bad.  She is just worried about the swelling.  She has not had any fevers.  She denies any injuries.  No cuts, scrapes, ulcers.  No other complaints.  HPI  Past Medical History:  Diagnosis Date   COPD (chronic obstructive pulmonary disease) ([place])     There are no problems to display for this patient.   Past Surgical History:  Procedure Laterality Date   AUGMENTATION MAMMAPLASTY Bilateral    over 30  years ago    OB History   No obstetric history on file.      Home Medications    Prior to Admission medications   Medication Sig Start Date End Date Taking? Authorizing Provider  predniSONE (DELTASONE) 10 MG tablet Take 6 tabs p.o. on day 1 and decrease by 1 tablet daily until complete 10/03/21  Yes Laurene Footman B, PA-C  albuterol (PROVENTIL HFA;VENTOLIN HFA) 108 (90 Base) MCG/ACT inhaler Inhale 2 puffs into the lungs every 6 (six) hours as needed for wheezing or shortness of breath. 07/28/18   Merlyn Lot, MD  amoxicillin-clavulanate (AUGMENTIN) 875-125 MG tablet Take 1 tablet by mouth every 12 (twelve) hours. 09/24/19   Coral Spikes, DO  cholestyramine light (PREVALITE) 4 GM/DOSE powder SMARTSIG:1 Scoopful By Mouth Twice Daily 05/19/19   [provider]  citalopram (CELEXA) 20 MG tablet TAKE 1 TABLET EVERY DAY  12/04/18   [provider]  ipratropium-albuterol (DUONEB) 0.5-2.5 (3) MG/3ML SOLN Take 3 mLs by nebulization every 6 (six) hours as needed. 07/28/18   Merlyn Lot, MD  ketorolac (TORADOL) 10 MG tablet Take 1 tablet (10 mg total) by mouth every 6 (six) hours as needed for moderate pain or severe pain. 09/24/19   Coral Spikes, DO  lisinopril (ZESTRIL) 10 MG tablet TAKE 1 TABLET EVERY DAY 04/02/19   [provider]  oxybutynin (DITROPAN) 5 MG tablet TAKE 1 TABLET TWICE DAILY 09/23/19   [provider]  oxyCODONE-acetaminophen (PERCOCET/ROXICET) 5-325 MG tablet TAKE ONE AND ONE-HALF TABLET BY MOUTH EVERY 6 HOURS AS NEEDED FOR PAIN 09/03/19   [provider]  simvastatin (ZOCOR) 40 MG tablet TAKE 1 TABLET EVERY DAY 04/02/19   [provider]    Family History Family History  Problem Relation Age of Onset   Breast cancer Sister 15    Social History Social History   Tobacco Use   Smoking status: Former   Smokeless tobacco: Never  Scientific laboratory technician Use: Never used  Substance Use Topics   Alcohol use: Not Currently   Drug use: Not Currently     Allergies   Patient has no known allergies.   Review of Systems Review of Systems  Constitutional:  Negative for fatigue and fever.  Musculoskeletal:  Positive for arthralgias and joint swelling. Negative for gait problem.  Skin:  Positive for color change. Negative for rash and wound.  Neurological:  Negative for weakness and numbness.    Physical Exam Triage Vital Signs ED Triage Vitals [10/03/21 1300]  Enc Vitals Group     BP (!) 190/77     Pulse Rate 100     Resp 18     Temp 98.1 F (36.7 C)     Temp Source Oral     SpO2 95 %     Weight 263 lb (119.3 kg)     Height 5' 1.2" (1.554 m)     Head Circumference      Peak Flow      Pain Score 5     Pain Loc      Pain Edu?      Excl. in [place]?    No data found.  Updated Vital Signs BP 132/76    Pulse 100    Temp 98.1 F (36.7 C)  (Oral)    Resp 18    Ht 5' 1.2" (1.554 m)    Wt 263 lb (119.3 kg)    SpO2 95%    BMI 49.37 kg/m      Physical Exam Vitals and nursing note reviewed.  Constitutional:      General: She is not in acute distress.    Appearance: Normal appearance. She is not ill-appearing or toxic-appearing.  HENT:     Head: Normocephalic and atraumatic.  Eyes:     General: No scleral icterus.       Right eye: No discharge.        Left eye: No discharge.     Conjunctiva/sclera: Conjunctivae normal.  Cardiovascular:     Rate and Rhythm: Normal rate and regular rhythm.     Heart sounds: Normal heart sounds.  Pulmonary:     Effort: Pulmonary effort is normal. No respiratory distress.     Breath sounds: Normal breath sounds.  Musculoskeletal:     Cervical back: Neck supple.     Right foot: Normal range of motion. Swelling (moderate swelling, erythema, warmth diffuse dorsal foot) and tenderness (diffuse TTP dorsal foot and medial/lateral ankle) present. Normal pulse.  Skin:    General: Skin is dry.  Neurological:     General: No focal deficit present.     Mental Status: She is alert. Mental status is at baseline.     Motor: No weakness.     Gait: Gait normal.  Psychiatric:        Mood and Affect: Mood normal.        Behavior: Behavior normal.        Thought Content: Thought content normal.     UC Treatments / Results  Labs (all labs ordered are listed, but only abnormal results are displayed) Labs Reviewed - No data to display  EKG   Radiology No results found.  Procedures Procedures (including critical care time)  Medications Ordered in UC Medications - No data to display  Initial Impression / Assessment and Plan / UC Course  I have reviewed the triage vital signs and the nursing notes.  Pertinent labs & imaging results that were available during my care of the patient were reviewed by me and considered in my medical decision making (see chart for details).    71 year old female  presenting for right foot pain, swelling and redness.  No injury.  History of significant arthritis and chronic pain.  On  exam she has diffuse swelling, erythema and warmth of the dorsal right foot.  No localized tenderness.  Tenderness is throughout the dorsal foot and medial/lateral ankle.  Increased warmth noted.  No lesions, abrasions, ulcerations noted.  Full range of motion of foot and ankle.  Presentation suspicious for flareup of underlying arthritis.  Treating with prednisone.  Advised to continue at home medication.  Reviewed RICE guidelines.  Reviewed monitoring closely if no improvement in the next couple of days or if she were to develop fever, increased pain, redness or swelling she needs to be seen again immediately.  Reviewed going to ED for any severe acute worsening symptoms.  Final Clinical Impressions(s) / UC Diagnoses   Final diagnoses:  Foot pain, right  Foot swelling     Discharge Instructions      -I have sent prednisone to the pharmacy.  This should help with the pain and swelling.  Elevate foot and consider use of compression hose.  You can also apply ice to the foot to help with swelling 2. - Continue your home pain medication if needed. - If not improving in the next couple of days or if the swelling or redness worsen or you develop a fever or increased pain you need to be seen again immediately.  For any severe acute worsening symptoms need to go to the ER.  Otherwise, please follow-up with PCP.   ED Prescriptions     Medication Sig Dispense Auth. Provider   predniSONE (DELTASONE) 10 MG tablet Take 6 tabs p.o. on day 1 and decrease by 1 tablet daily until complete 21 tablet Danton Clap, PA-C      I have reviewed the PDMP during this encounter.   Danton Clap, PA-C 10/03/21 1407

## 2021-10-03 NOTE — ED Triage Notes (Signed)
Pt reports having pain and swelling in R foot. Sts she have arthritis in foot and it has flared up.

## 2021-10-03 NOTE — Discharge Instructions (Addendum)
-  I have sent prednisone to the pharmacy.  This should help with the pain and swelling.  Elevate foot and consider use of compression hose.  You can also apply ice to the foot to help with swelling 2. - Continue your home pain medication if needed. - If not improving in the next couple of days or if the swelling or redness worsen or you develop a fever or increased pain you need to be seen again immediately.  For any severe acute worsening symptoms need to go to the ER.  Otherwise, please follow-up with PCP.

## 2021-10-05 ENCOUNTER — Telehealth: Payer: Self-pay

## 2021-10-05 ENCOUNTER — Other Ambulatory Visit: Payer: Self-pay

## 2021-10-05 ENCOUNTER — Ambulatory Visit: Payer: Medicare HMO | Admitting: Surgery

## 2021-10-05 ENCOUNTER — Encounter: Payer: Self-pay | Admitting: Surgery

## 2021-10-05 VITALS — BP 171/92 | HR 85 | Temp 98.0°F | Ht 61.5 in | Wt 249.0 lb

## 2021-10-05 DIAGNOSIS — K420 Umbilical hernia with obstruction, without gangrene: Secondary | ICD-10-CM | POA: Diagnosis not present

## 2021-10-05 NOTE — Telephone Encounter (Signed)
Medical Clearance faxed to Dr.John Edwina Barth at [facility].  Pulmonary Clearance faxed to Bee at [facility]- [phone].  Patient remained to call the above providers to schedule office visit as it has been a while since her last office visits due to Clearances needed.

## 2021-10-05 NOTE — Progress Notes (Signed)
10/05/2021  Reason for Visit:  Incarcerated umbilical hernia  History of Present Illness: Marissa Hayden is a 71 y.o. female presenting for evaluation of an incarcerated umbilical hernia.  The patient presented to the ED on 12/26 with a 3 day history of abdominal pain at the umbilicus associated with nausea and constipation.  She has COPD and reports that this started after a coughing fit.  The hernia was bulging, was very tender, and larger/harder than usual.  She had a CT scan which showed a 5.3 cm hernia defect containing transverse colon with some stranding in the hernia sac.  It was successfully reduced by ER physician and the patient reported immediate relief in the pain that she was having.  She was able to be discharged home with close follow up.  Her labwork that day had a WBC of 13.7 with mild hyponatremia but normal kidney function.    Since her ED visit, she reports that she's been wearing a hernia belt which she says is helping a lot.  However, she's afraid of coughing hard and is very interested in surgical repair.  She reports she's been doing some research about her hernia and repair options.  Currently, she has soreness at the umbilicus, but is tolerating a diet, having bowel function, and without worsening pain.  Of note she reports having a tubal ligation many years ago.  Also has issues with chronic pain, COPD, and HTN.  Past Medical History: Past Medical History:  Diagnosis Date   COPD (chronic obstructive pulmonary disease) ([place])      Past Surgical History: Past Surgical History:  Procedure Laterality Date   AUGMENTATION MAMMAPLASTY Bilateral    over 30  years ago    Home Medications: Prior to Admission medications   Medication Sig Start Date End Date Taking? Authorizing Provider  albuterol (PROVENTIL HFA;VENTOLIN HFA) 108 (90 Base) MCG/ACT inhaler Inhale 2 puffs into the lungs every 6 (six) hours as needed for wheezing or shortness of breath. 07/28/18  Yes  Merlyn Lot, MD  cholestyramine light (PREVALITE) 4 GM/DOSE powder SMARTSIG:1 Scoopful By Mouth Twice Daily 05/19/19  Yes [provider]  citalopram (CELEXA) 20 MG tablet TAKE 1 TABLET EVERY DAY 12/04/18  Yes [provider]  ipratropium-albuterol (DUONEB) 0.5-2.5 (3) MG/3ML SOLN Take 3 mLs by nebulization every 6 (six) hours as needed. 07/28/18  Yes Merlyn Lot, MD  ketorolac (TORADOL) 10 MG tablet Take 1 tablet (10 mg total) by mouth every 6 (six) hours as needed for moderate pain or severe pain. 09/24/19  Yes Cook, Jayce G, DO  lisinopril (ZESTRIL) 10 MG tablet TAKE 1 TABLET EVERY DAY 04/02/19  Yes [provider]  oxybutynin (DITROPAN) 5 MG tablet TAKE 1 TABLET TWICE DAILY 09/23/19  Yes [provider]  oxyCODONE-acetaminophen (PERCOCET/ROXICET) 5-325 MG tablet TAKE ONE AND ONE-HALF TABLET BY MOUTH EVERY 6 HOURS AS NEEDED FOR PAIN 09/03/19  Yes [provider]  predniSONE (DELTASONE) 10 MG tablet Take 6 tabs p.o. on day 1 and decrease by 1 tablet daily until complete 10/03/21  Yes Laurene Footman B, PA-C  simvastatin (ZOCOR) 40 MG tablet TAKE 1 TABLET EVERY DAY 04/02/19  Yes [provider]    Allergies: No Known Allergies  Social History:  reports that she has quit smoking. She has never used smokeless tobacco. She reports that she does not currently use alcohol. She reports that she does not currently use drugs.   Family History: Family History  Problem Relation Age of Onset  Diabetes Father    Breast cancer Sister 40    Review of Systems: Review of Systems  Constitutional:  Negative for chills and fever.  HENT:  Negative for hearing loss.   Respiratory:  Negative for shortness of breath.   Cardiovascular:  Negative for chest pain.  Gastrointestinal:  Positive for abdominal pain, constipation and nausea. Negative for diarrhea and vomiting.  Genitourinary:  Negative for dysuria.  Musculoskeletal:  Negative for myalgias.   Skin:  Negative for rash.  Neurological:  Negative for dizziness.  Psychiatric/Behavioral:  Negative for depression.    Physical Exam BP (!) 171/92    Pulse 85    Temp 98 F (36.7 C) (Oral)    Ht 5' 1.5" (1.562 m)    Wt 249 lb (112.9 kg)    SpO2 90%    BMI 46.29 kg/m  CONSTITUTIONAL: No acute distress. HEENT:  Normocephalic, atraumatic, extraocular motion intact. NECK: Trachea is midline, and there is no jugular venous distension.  RESPIRATORY:  Lungs are clear, and breath sounds are equal bilaterally. Normal respiratory effort without pathologic use of accessory muscles. CARDIOVASCULAR: Heart is regular without murmurs, gallops, or rubs. GI: The abdomen is soft, obese, non-distended, with some discomfort to palpation at the umbilicus, where she has a moderate sized hernia that is only partially reducible.  The hernia contents feel soft, without evidence of strangulation.  MUSCULOSKELETAL:  Normal muscle strength and tone in all four extremities.  Patient has mild edema/cellulitis of right foot, currently being treated. SKIN: Skin turgor is normal. There are no pathologic skin lesions.  NEUROLOGIC:  Motor and sensation is grossly normal.  Cranial nerves are grossly intact. PSYCH:  Alert and oriented to person, place and time. Affect is normal.  Laboratory Analysis: Labs from 09/17/21: Na 130, K 3.9, Cl 95, CO2 26, BUN 16, Cr 0.64, Total bili 0.8, AST 15, ALT 16, Alk Phos 62, lipase 22.  WBC 13.7, Hgb 15, Hct 45.5, Plt 300  Imaging: CT scan abdomen/pelvis on 09/17/21: IMPRESSION: 1. Mild inflammation within a large ventral hernia containing fat and nonobstructed portion of colon with a 5.3 cm aperture, recommend clinical correlation for reducibility. 2. Sigmoid colonic diverticulosis without findings of acute diverticulitis. 3. Calcified uterine leiomyomas. 4.  Aortic Atherosclerosis (ICD10-I70.0).  Assessment and Plan: This is a 71 y.o. female with an incarcerated umbilical  hernia.  --Discussed with the patient the findings on her CT scan showing the transverse colon bulging through the hernia and how that could be associated with the pain and nausea she was having.  On exam today, the patient still has some contents in her hernia sac and this bulges more as soon as she coughs or strains.  I'm unable to fully reduce the hernia on my exam, though today it's not as tender as she was in the ED based on her report. --Although her BMI and COPD would put her at risk for hernia recurrence, she presented to the ED with almost a strangulation episode, so I think it would be best to repair the hernia sooner rather than wait for her to loose considerable weight.  Discussed with her the role of a robotic assisted incarcerated umbilical hernia repair.  Reviewed with her the surgery at length, including the risks of bleeding, infection, injury to surrounding structures, that this would likely be an outpatient surgery, pain control, activity restriction, use of abdominal binder after surgery, and she's willing to proceed. --Will schedule her tentatively for 10/23/21.  Will send both medical and pulmonary  clearances for surgery.  All questions answered.  I spent 60 minutes dedicated to the care of this patient on the date of this encounter to include pre-visit review of records, face-to-face time with the patient discussing diagnosis and management, and any post-visit coordination of care.   Melvyn Neth, [facility]

## 2021-10-05 NOTE — Patient Instructions (Addendum)
Medical and Pulmonary Clearances faxed to Dr.John Edwina Barth and Dr.Herbon Raul Del. Patient to call providers and schedule office visit due to needing Clearance forms completed.   Umbilical Hernia, Adult A hernia is a bulge of tissue that pushes through an opening between muscles. An umbilical hernia happens in the abdomen, near the belly button (umbilicus). The hernia may contain tissues from the small intestine, large intestine, or fatty tissue covering the intestines. Umbilical hernias in adults tend to get worse over time, and they require surgical treatment. There are different types of umbilical hernias, including: Indirect hernia. This type is located just above or below the umbilicus. It is the most common type of umbilical hernia in adults. Direct hernia. This type forms through an opening formed by the umbilicus. Reducible hernia. This type of hernia comes and goes. It may be visible only when you strain, lift something heavy, or cough. This type of hernia can be pushed back into the abdomen (reduced). Incarcerated hernia. This type traps abdominal tissue inside the hernia. This type of hernia cannot be reduced. Strangulated hernia. This type of hernia cuts off blood flow to the tissues inside the hernia. The tissues can start to die if this happens. This type of hernia requires emergency treatment. What are the causes? An umbilical hernia happens when tissue inside the abdomen presses on a weak area of the abdominal muscles. What increases the risk? You may have a greater risk of this condition if you: Are obese. Have had several pregnancies. Have a buildup of fluid inside your abdomen. Have had surgery that weakens the abdominal muscles. What are the signs or symptoms? The main symptom of this condition is a painless bulge at or near the belly button. A reducible hernia may be visible only when you strain, lift something heavy, or cough. Other symptoms may include: Dull pain. A  feeling of pressure. Symptoms of a strangulated hernia may include: Pain that gets increasingly worse. Nausea and vomiting. Pain when pressing on the hernia. Skin over the hernia becoming red or purple. Constipation. Blood in the stool. How is this diagnosed? This condition may be diagnosed based on: A physical exam. You may be asked to cough or strain while standing. These actions increase the pressure inside your abdomen and can force the hernia through the opening in your muscles. Your health care provider may try to reduce the hernia by pressing on it. Your symptoms and medical history. How is this treated? Surgery is the only treatment for an umbilical hernia. Surgery for a strangulated hernia is done as soon as possible. If you have a small hernia that is not incarcerated, you may need to lose weight before having surgery. Follow these instructions at home: Lose weight, if told by your health care provider. Do not try to push the hernia back in. Watch your hernia for any changes in color or size. Tell your health care provider if any changes occur. You may need to avoid activities that increase pressure on your hernia. Do not lift anything that is heavier than 10 lb (4.5 kg), or the limit that you are told, until your health care provider says that it is safe. Take over-the-counter and prescription medicines only as told by your health care provider. Keep all follow-up visits. This is important. Contact a health care provider if: Your hernia gets larger. Your hernia becomes painful. Get help right away if: You develop sudden, severe pain near the area of your hernia. You have pain as well as nausea  or vomiting. You have pain and the skin over your hernia changes color. You develop a fever or chills. Summary A hernia is a bulge of tissue that pushes through an opening between muscles. An umbilical hernia happens near the belly button. Surgery is the only treatment for an  umbilical hernia. Do not try to push your hernia back in. Keep all follow-up visits. This is important. This information is not intended to replace advice given to you by your health care provider. Make sure you discuss any questions you have with your health care provider. Document Revised: 04/17/2020 Document Reviewed: 04/17/2020 Elsevier Patient Education  [employer].

## 2021-10-05 NOTE — H&P (View-Only) (Signed)
10/05/2021  Reason for Visit:  Incarcerated umbilical hernia  History of Present Illness: Marissa Hayden is a 71 y.o. female presenting for evaluation of an incarcerated umbilical hernia.  The patient presented to the ED on 12/26 with a 3 day history of abdominal pain at the umbilicus associated with nausea and constipation.  She has COPD and reports that this started after a coughing fit.  The hernia was bulging, was very tender, and larger/harder than usual.  She had a CT scan which showed a 5.3 cm hernia defect containing transverse colon with some stranding in the hernia sac.  It was successfully reduced by ER physician and the patient reported immediate relief in the pain that she was having.  She was able to be discharged home with close follow up.  Her labwork that day had a WBC of 13.7 with mild hyponatremia but normal kidney function.    Since her ED visit, she reports that she's been wearing a hernia belt which she says is helping a lot.  However, she's afraid of coughing hard and is very interested in surgical repair.  She reports she's been doing some research about her hernia and repair options.  Currently, she has soreness at the umbilicus, but is tolerating a diet, having bowel function, and without worsening pain.  Of note she reports having a tubal ligation many years ago.  Also has issues with chronic pain, COPD, and HTN.  Past Medical History: Past Medical History:  Diagnosis Date   COPD (chronic obstructive pulmonary disease) ([place])      Past Surgical History: Past Surgical History:  Procedure Laterality Date   AUGMENTATION MAMMAPLASTY Bilateral    over 30  years ago    Home Medications: Prior to Admission medications   Medication Sig Start Date End Date Taking? Authorizing Provider  albuterol (PROVENTIL HFA;VENTOLIN HFA) 108 (90 Base) MCG/ACT inhaler Inhale 2 puffs into the lungs every 6 (six) hours as needed for wheezing or shortness of breath. 07/28/18  Yes  Merlyn Lot, MD  cholestyramine light (PREVALITE) 4 GM/DOSE powder SMARTSIG:1 Scoopful By Mouth Twice Daily 05/19/19  Yes [provider]  citalopram (CELEXA) 20 MG tablet TAKE 1 TABLET EVERY DAY 12/04/18  Yes [provider]  ipratropium-albuterol (DUONEB) 0.5-2.5 (3) MG/3ML SOLN Take 3 mLs by nebulization every 6 (six) hours as needed. 07/28/18  Yes Merlyn Lot, MD  ketorolac (TORADOL) 10 MG tablet Take 1 tablet (10 mg total) by mouth every 6 (six) hours as needed for moderate pain or severe pain. 09/24/19  Yes Cook, Jayce G, DO  lisinopril (ZESTRIL) 10 MG tablet TAKE 1 TABLET EVERY DAY 04/02/19  Yes [provider]  oxybutynin (DITROPAN) 5 MG tablet TAKE 1 TABLET TWICE DAILY 09/23/19  Yes [provider]  oxyCODONE-acetaminophen (PERCOCET/ROXICET) 5-325 MG tablet TAKE ONE AND ONE-HALF TABLET BY MOUTH EVERY 6 HOURS AS NEEDED FOR PAIN 09/03/19  Yes [provider]  predniSONE (DELTASONE) 10 MG tablet Take 6 tabs p.o. on day 1 and decrease by 1 tablet daily until complete 10/03/21  Yes Laurene Footman B, PA-C  simvastatin (ZOCOR) 40 MG tablet TAKE 1 TABLET EVERY DAY 04/02/19  Yes [provider]    Allergies: No Known Allergies  Social History:  reports that she has quit smoking. She has never used smokeless tobacco. She reports that she does not currently use alcohol. She reports that she does not currently use drugs.   Family History: Family History  Problem Relation Age of Onset  Diabetes Father    Breast cancer Sister 63    Review of Systems: Review of Systems  Constitutional:  Negative for chills and fever.  HENT:  Negative for hearing loss.   Respiratory:  Negative for shortness of breath.   Cardiovascular:  Negative for chest pain.  Gastrointestinal:  Positive for abdominal pain, constipation and nausea. Negative for diarrhea and vomiting.  Genitourinary:  Negative for dysuria.  Musculoskeletal:  Negative for myalgias.   Skin:  Negative for rash.  Neurological:  Negative for dizziness.  Psychiatric/Behavioral:  Negative for depression.    Physical Exam BP (!) 171/92    Pulse 85    Temp 98 F (36.7 C) (Oral)    Ht 5' 1.5" (1.562 m)    Wt 249 lb (112.9 kg)    SpO2 90%    BMI 46.29 kg/m  CONSTITUTIONAL: No acute distress. HEENT:  Normocephalic, atraumatic, extraocular motion intact. NECK: Trachea is midline, and there is no jugular venous distension.  RESPIRATORY:  Lungs are clear, and breath sounds are equal bilaterally. Normal respiratory effort without pathologic use of accessory muscles. CARDIOVASCULAR: Heart is regular without murmurs, gallops, or rubs. GI: The abdomen is soft, obese, non-distended, with some discomfort to palpation at the umbilicus, where she has a moderate sized hernia that is only partially reducible.  The hernia contents feel soft, without evidence of strangulation.  MUSCULOSKELETAL:  Normal muscle strength and tone in all four extremities.  Patient has mild edema/cellulitis of right foot, currently being treated. SKIN: Skin turgor is normal. There are no pathologic skin lesions.  NEUROLOGIC:  Motor and sensation is grossly normal.  Cranial nerves are grossly intact. PSYCH:  Alert and oriented to person, place and time. Affect is normal.  Laboratory Analysis: Labs from 09/17/21: Na 130, K 3.9, Cl 95, CO2 26, BUN 16, Cr 0.64, Total bili 0.8, AST 15, ALT 16, Alk Phos 62, lipase 22.  WBC 13.7, Hgb 15, Hct 45.5, Plt 300  Imaging: CT scan abdomen/pelvis on 09/17/21: IMPRESSION: 1. Mild inflammation within a large ventral hernia containing fat and nonobstructed portion of colon with a 5.3 cm aperture, recommend clinical correlation for reducibility. 2. Sigmoid colonic diverticulosis without findings of acute diverticulitis. 3. Calcified uterine leiomyomas. 4.  Aortic Atherosclerosis (ICD10-I70.0).  Assessment and Plan: This is a 71 y.o. female with an incarcerated umbilical  hernia.  --Discussed with the patient the findings on her CT scan showing the transverse colon bulging through the hernia and how that could be associated with the pain and nausea she was having.  On exam today, the patient still has some contents in her hernia sac and this bulges more as soon as she coughs or strains.  I'm unable to fully reduce the hernia on my exam, though today it's not as tender as she was in the ED based on her report. --Although her BMI and COPD would put her at risk for hernia recurrence, she presented to the ED with almost a strangulation episode, so I think it would be best to repair the hernia sooner rather than wait for her to loose considerable weight.  Discussed with her the role of a robotic assisted incarcerated umbilical hernia repair.  Reviewed with her the surgery at length, including the risks of bleeding, infection, injury to surrounding structures, that this would likely be an outpatient surgery, pain control, activity restriction, use of abdominal binder after surgery, and she's willing to proceed. --Will schedule her tentatively for 10/23/21.  Will send both medical and pulmonary  clearances for surgery.  All questions answered.  I spent 60 minutes dedicated to the care of this patient on the date of this encounter to include pre-visit review of records, face-to-face time with the patient discussing diagnosis and management, and any post-visit coordination of care.   Melvyn Neth, [facility]

## 2021-10-08 ENCOUNTER — Telehealth: Payer: Self-pay | Admitting: Surgery

## 2021-10-08 NOTE — Telephone Encounter (Signed)
Patient has been advised of Pre-Admission date/time, COVID Testing date and Surgery date.  Surgery Date: 10/23/21 Preadmission Testing Date: 10/17/21 (phone 8a-1p) Covid Testing Date: Not needed.     Patient has been made aware to call [phone], between 1-3:00pm the day before surgery, to find out what time to arrive for surgery.

## 2021-10-08 NOTE — Telephone Encounter (Signed)
Called patient back and answered questions.

## 2021-10-08 NOTE — Telephone Encounter (Signed)
Just an FYI, patient states that she has an appointment with Dr. Raul Del with pulmonology on 10/12/20. She is still in the process of trying to get in touch with Dr. Durenda Age office. She is pending medical and pulmonary clearance for surgery scheduled on 10/23/21.  Thank you.

## 2021-10-10 DIAGNOSIS — J449 Chronic obstructive pulmonary disease, unspecified: Secondary | ICD-10-CM | POA: Diagnosis not present

## 2021-10-10 DIAGNOSIS — Z01818 Encounter for other preprocedural examination: Secondary | ICD-10-CM | POA: Diagnosis not present

## 2021-10-10 DIAGNOSIS — I10 Essential (primary) hypertension: Secondary | ICD-10-CM | POA: Diagnosis not present

## 2021-10-10 DIAGNOSIS — Z6841 Body Mass Index (BMI) 40.0 and over, adult: Secondary | ICD-10-CM | POA: Diagnosis not present

## 2021-10-12 DIAGNOSIS — R0902 Hypoxemia: Secondary | ICD-10-CM | POA: Diagnosis not present

## 2021-10-12 DIAGNOSIS — J449 Chronic obstructive pulmonary disease, unspecified: Secondary | ICD-10-CM | POA: Diagnosis not present

## 2021-10-12 DIAGNOSIS — Z01818 Encounter for other preprocedural examination: Secondary | ICD-10-CM | POA: Diagnosis not present

## 2021-10-12 DIAGNOSIS — R053 Chronic cough: Secondary | ICD-10-CM | POA: Diagnosis not present

## 2021-10-15 DIAGNOSIS — J449 Chronic obstructive pulmonary disease, unspecified: Secondary | ICD-10-CM | POA: Diagnosis not present

## 2021-10-15 NOTE — Progress Notes (Unsigned)
Pulmonary Clearance received from Dr Raul Del. The patient is cleared at Medium risk for surgery.

## 2021-10-16 NOTE — Progress Notes (Unsigned)
Medical clearance has been received from Dr Edwina Barth. The patient is cleared at Medium risk.

## 2021-10-17 ENCOUNTER — Encounter
Admission: RE | Admit: 2021-10-17 | Discharge: 2021-10-17 | Disposition: A | Discharge status: Home / Self Care | Payer: Medicare HMO | Source: Ambulatory Visit | Attending: Surgery | Admitting: Surgery

## 2021-10-17 ENCOUNTER — Other Ambulatory Visit: Payer: Self-pay

## 2021-10-17 HISTORY — DX: Hyperlipidemia, unspecified: E78.5

## 2021-10-17 HISTORY — DX: Bipolar disorder, unspecified: F31.9

## 2021-10-17 HISTORY — DX: Gastro-esophageal reflux disease without esophagitis: K21.9

## 2021-10-17 HISTORY — DX: Essential (primary) hypertension: I10

## 2021-10-17 HISTORY — DX: Dependence on supplemental oxygen: Z99.81

## 2021-10-17 HISTORY — DX: Anxiety disorder, unspecified: F41.9

## 2021-10-17 HISTORY — DX: Other cervical disc degeneration, unspecified cervical region: M50.30

## 2021-10-17 HISTORY — DX: Obesity, unspecified: E66.9

## 2021-10-17 HISTORY — DX: Restless legs syndrome: G25.81

## 2021-10-17 HISTORY — DX: Depression, unspecified: F32.A

## 2021-10-17 HISTORY — DX: Other complications of anesthesia, initial encounter: T88.59XA

## 2021-10-17 HISTORY — DX: Dyspnea, unspecified: R06.00

## 2021-10-17 NOTE — Patient Instructions (Signed)
Your procedure is scheduled on:10-23-21 Tuesday Report to the Registration Desk on the 1st floor of the [facility].Then proceed to the 2nd floor surgery desk in the medical mall To find out your arrival time, please call [phone] between 1PM - 3PM on:10-22-21 Monday  REMEMBER: Instructions that are not followed completely may result in serious medical risk, up to and including death; or upon the discretion of your surgeon and anesthesiologist your surgery may need to be rescheduled.  Do not eat food after midnight the night before surgery.  No gum chewing, lozengers or hard candies.  You may however, drink CLEAR liquids up to 2 hours before you are scheduled to arrive for your surgery. Do not drink anything within 2 hours of your scheduled arrival time.  Clear liquids include: - water  - apple juice without pulp - gatorade (not RED, PURPLE, OR BLUE) - black coffee or tea (Do NOT add milk or creamers to the coffee or tea) Do NOT drink anything that is not on this list.  TAKE THESE MEDICATIONS THE MORNING OF SURGERY WITH A SIP OF WATER: -citalopram (CELEXA) -simvastatin (ZOCOR)  Use your ipratropium-albuterol (DUONEB) the morning of surgery and bring your albuterol (PROVENTIL HFA;VENTOLIN ) Inhaler to the hospital  One week prior to surgery: Stop Anti-inflammatories (NSAIDS) such as Advil, Aleve, Ibuprofen, Motrin, Naproxen, Naprosyn and Aspirin based products such as Excedrin, Goodys Powder, BC Powder.You may however, take Tylenol/Percocet  if needed for pain up until the day of surgery.  Stop ANY OVER THE COUNTER supplements/vitamins NOW (10-17-21) until after surgery.  No Alcohol for 24 hours before or after surgery.  No Smoking including e-cigarettes for 24 hours prior to surgery.  No chewable tobacco products for at least 6 hours prior to surgery.  No nicotine patches on the day of surgery.  Do not use any "recreational" drugs for at least a week prior to your surgery.   Please be advised that the combination of cocaine and anesthesia may have negative outcomes, up to and including death. If you test positive for cocaine, your surgery will be cancelled.  On the morning of surgery brush your teeth with toothpaste and water, you may rinse your mouth with mouthwash if you wish. Do not swallow any toothpaste or mouthwash.  Use CHG Soap as directed on instruction sheet.  Do not wear jewelry, make-up, hairpins, clips or nail polish.  Do not wear lotions, powders, or perfumes.   Do not shave body from the neck down 48 hours prior to surgery just in case you cut yourself which could leave a site for infection.  Also, freshly shaved skin may become irritated if using the CHG soap.  Contact lenses, hearing aids and dentures may not be worn into surgery.  Do not bring valuables to the hospital. [facility] is not responsible for any missing/lost belongings or valuables.   Notify your doctor if there is any change in your medical condition (cold, fever, infection).  Wear comfortable clothing (specific to your surgery type) to the hospital.  After surgery, you can help prevent lung complications by doing breathing exercises.  Take deep breaths and cough every 1-2 hours. Your doctor may order a device called an Incentive Spirometer to help you take deep breaths. When coughing or sneezing, hold a pillow firmly against your incision with both hands. This is called splinting. Doing this helps protect your incision. It also decreases belly discomfort.  If you are being admitted to the hospital overnight, leave your suitcase  in the car. After surgery it may be brought to your room.  If you are being discharged the day of surgery, you will not be allowed to drive home. You will need a responsible adult (18 years or older) to drive you home and stay with you that night.   If you are taking public transportation, you will need to have a responsible adult (18 years or  older) with you. Please confirm with your physician that it is acceptable to use public transportation.   Please call the [place] Dept. at [phone] if you have any questions about these instructions.  Surgery Visitation Policy:  Patients undergoing a surgery or procedure may have one family member or support person with them as long as that person is not COVID-19 positive or experiencing its symptoms.  That person may remain in the waiting area during the procedure and may rotate out with other people.  Inpatient Visitation:    Visiting hours are 7 a.m. to 8 p.m. Up to two visitors ages 16+ are allowed at one time in a patient room. The visitors may rotate out with other people during the day. Visitors must check out when they leave, or other visitors will not be allowed. One designated support person may remain overnight. The visitor must pass COVID-19 screenings, use hand sanitizer when entering and exiting the patients room and wear a mask at all times, including in the patients room. Patients must also wear a mask when staff or their visitor are in the room. Masking is required regardless of vaccination status.

## 2021-10-23 ENCOUNTER — Other Ambulatory Visit: Payer: Self-pay

## 2021-10-23 ENCOUNTER — Ambulatory Visit: Payer: Medicare HMO | Admitting: Certified Registered Nurse Anesthetist

## 2021-10-23 ENCOUNTER — Encounter: Payer: Self-pay | Admitting: Surgery

## 2021-10-23 ENCOUNTER — Encounter
Admission: RE | Disposition: A | Discharge status: Home / Self Care | Payer: Self-pay | Source: Home / Self Care | Attending: Surgery

## 2021-10-23 ENCOUNTER — Ambulatory Visit
Admission: RE | Admit: 2021-10-23 | Discharge: 2021-10-23 | Disposition: A | Discharge status: Home / Self Care | Payer: Medicare HMO | Attending: Surgery | Admitting: Surgery

## 2021-10-23 DIAGNOSIS — I700 Atherosclerosis of aorta: Secondary | ICD-10-CM | POA: Insufficient documentation

## 2021-10-23 DIAGNOSIS — E785 Hyperlipidemia, unspecified: Secondary | ICD-10-CM | POA: Diagnosis not present

## 2021-10-23 DIAGNOSIS — J449 Chronic obstructive pulmonary disease, unspecified: Secondary | ICD-10-CM | POA: Diagnosis not present

## 2021-10-23 DIAGNOSIS — K5730 Diverticulosis of large intestine without perforation or abscess without bleeding: Secondary | ICD-10-CM | POA: Diagnosis not present

## 2021-10-23 DIAGNOSIS — Z9981 Dependence on supplemental oxygen: Secondary | ICD-10-CM | POA: Insufficient documentation

## 2021-10-23 DIAGNOSIS — Z87891 Personal history of nicotine dependence: Secondary | ICD-10-CM | POA: Diagnosis not present

## 2021-10-23 DIAGNOSIS — D259 Leiomyoma of uterus, unspecified: Secondary | ICD-10-CM | POA: Diagnosis not present

## 2021-10-23 DIAGNOSIS — Z6841 Body Mass Index (BMI) 40.0 and over, adult: Secondary | ICD-10-CM | POA: Diagnosis not present

## 2021-10-23 DIAGNOSIS — K420 Umbilical hernia with obstruction, without gangrene: Secondary | ICD-10-CM | POA: Diagnosis not present

## 2021-10-23 DIAGNOSIS — K436 Other and unspecified ventral hernia with obstruction, without gangrene: Secondary | ICD-10-CM | POA: Diagnosis not present

## 2021-10-23 DIAGNOSIS — I10 Essential (primary) hypertension: Secondary | ICD-10-CM | POA: Insufficient documentation

## 2021-10-23 HISTORY — PX: XI ROBOTIC ASSISTED VENTRAL HERNIA: SHX6789

## 2021-10-23 SURGERY — REPAIR, HERNIA, VENTRAL, ROBOT-ASSISTED
Anesthesia: General

## 2021-10-23 MED ORDER — IBUPROFEN 600 MG PO TABS
600.0000 mg | ORAL_TABLET | Freq: Three times a day (TID) | ORAL | 1 refills | Status: AC | PRN
Start: 2021-10-23 — End: ?

## 2021-10-23 MED ORDER — DEXAMETHASONE SODIUM PHOSPHATE 10 MG/ML IJ SOLN
INTRAMUSCULAR | Status: AC
  Filled 2021-10-23: qty 1

## 2021-10-23 MED ORDER — OXYCODONE HCL 5 MG PO TABS: 5.0000 mg | ORAL_TABLET | ORAL | 0 refills | Status: DC | PRN

## 2021-10-23 MED ORDER — FENTANYL CITRATE (PF) 100 MCG/2ML IJ SOLN
INTRAMUSCULAR | Status: AC
  Filled 2021-10-23: qty 2

## 2021-10-23 MED ORDER — DEXAMETHASONE SODIUM PHOSPHATE 10 MG/ML IJ SOLN
INTRAMUSCULAR | Status: DC | PRN
Start: 2021-10-23 — End: 2021-10-23
  Administered 2021-10-23: 10 mg via INTRAVENOUS

## 2021-10-23 MED ORDER — FENTANYL CITRATE (PF) 100 MCG/2ML IJ SOLN: 25.0000 ug | INTRAMUSCULAR | Status: DC | PRN

## 2021-10-23 MED ORDER — PHENYLEPHRINE HCL (PRESSORS) 10 MG/ML IV SOLN
INTRAVENOUS | Status: DC | PRN
Start: 2021-10-23 — End: 2021-10-23
  Administered 2021-10-23 (×2): 160 ug via INTRAVENOUS

## 2021-10-23 MED ORDER — SUGAMMADEX SODIUM 500 MG/5ML IV SOLN
INTRAVENOUS | Status: AC
  Filled 2021-10-23: qty 5

## 2021-10-23 MED ORDER — CHLORHEXIDINE GLUCONATE 0.12 % MT SOLN: 15.0000 mL | Freq: Once | OROMUCOSAL | Status: AC

## 2021-10-23 MED ORDER — PROPOFOL 10 MG/ML IV BOLUS
INTRAVENOUS | Status: AC
  Filled 2021-10-23: qty 20

## 2021-10-23 MED ORDER — ONDANSETRON HCL 4 MG/2ML IJ SOLN: 4.0000 mg | Freq: Once | INTRAMUSCULAR | Status: DC | PRN

## 2021-10-23 MED ORDER — PROPOFOL 10 MG/ML IV BOLUS
INTRAVENOUS | Status: DC | PRN
Start: 2021-10-23 — End: 2021-10-23
  Administered 2021-10-23: 50 mg via INTRAVENOUS
  Administered 2021-10-23: 200 mg via INTRAVENOUS

## 2021-10-23 MED ORDER — ACETAMINOPHEN 10 MG/ML IV SOLN
INTRAVENOUS | Status: AC
  Filled 2021-10-23: qty 100

## 2021-10-23 MED ORDER — BUPIVACAINE LIPOSOME 1.3 % IJ SUSP: 20.0000 mL | Freq: Once | INTRAMUSCULAR | Status: DC

## 2021-10-23 MED ORDER — KETOROLAC TROMETHAMINE 30 MG/ML IJ SOLN
INTRAMUSCULAR | Status: DC | PRN
Start: 2021-10-23 — End: 2021-10-23
  Administered 2021-10-23: 30 mg via INTRAVENOUS

## 2021-10-23 MED ORDER — SODIUM CHLORIDE FLUSH 0.9 % IV SOLN
INTRAVENOUS | Status: AC
  Filled 2021-10-23: qty 10

## 2021-10-23 MED ORDER — DEXMEDETOMIDINE (PRECEDEX) IN NS 20 MCG/5ML (4 MCG/ML) IV SYRINGE
PREFILLED_SYRINGE | INTRAVENOUS | Status: AC
  Filled 2021-10-23: qty 5

## 2021-10-23 MED ORDER — CEFAZOLIN SODIUM-DEXTROSE 2-4 GM/100ML-% IV SOLN
2.0000 g | INTRAVENOUS | Status: AC
  Administered 2021-10-23: 2 g via INTRAVENOUS

## 2021-10-23 MED ORDER — OXYCODONE HCL 5 MG/5ML PO SOLN: 5.0000 mg | Freq: Once | ORAL | Status: DC | PRN

## 2021-10-23 MED ORDER — 0.9 % SODIUM CHLORIDE (POUR BTL) OPTIME
TOPICAL | Status: DC | PRN
  Administered 2021-10-23: 200 mL

## 2021-10-23 MED ORDER — KETOROLAC TROMETHAMINE 30 MG/ML IJ SOLN
INTRAMUSCULAR | Status: AC
  Filled 2021-10-23: qty 1

## 2021-10-23 MED ORDER — PHENYLEPHRINE HCL-NACL 20-0.9 MG/250ML-% IV SOLN
INTRAVENOUS | Status: DC | PRN
  Administered 2021-10-23: 50 ug/min via INTRAVENOUS

## 2021-10-23 MED ORDER — ORAL CARE MOUTH RINSE: 15.0000 mL | Freq: Once | OROMUCOSAL | Status: AC

## 2021-10-23 MED ORDER — FENTANYL CITRATE (PF) 100 MCG/2ML IJ SOLN
INTRAMUSCULAR | Status: DC | PRN
Start: 2021-10-23 — End: 2021-10-23
  Administered 2021-10-23 (×2): 50 ug via INTRAVENOUS

## 2021-10-23 MED ORDER — CHLORHEXIDINE GLUCONATE CLOTH 2 % EX PADS
6.0000 | MEDICATED_PAD | Freq: Once | CUTANEOUS | Status: AC
  Administered 2021-10-23: 6 via TOPICAL

## 2021-10-23 MED ORDER — FAMOTIDINE 20 MG PO TABS
20.0000 mg | ORAL_TABLET | Freq: Once | ORAL | Status: AC
  Administered 2021-10-23: 20 mg via ORAL

## 2021-10-23 MED ORDER — CEFAZOLIN SODIUM-DEXTROSE 2-4 GM/100ML-% IV SOLN
INTRAVENOUS | Status: AC
  Filled 2021-10-23: qty 100

## 2021-10-23 MED ORDER — GABAPENTIN 300 MG PO CAPS
ORAL_CAPSULE | ORAL | Status: AC
  Filled 2021-10-23: qty 1

## 2021-10-23 MED ORDER — LACTATED RINGERS IV SOLN: INTRAVENOUS | Status: DC

## 2021-10-23 MED ORDER — SODIUM CHLORIDE (PF) 0.9 % IJ SOLN
INTRAMUSCULAR | Status: DC | PRN
  Administered 2021-10-23: 60 mL

## 2021-10-23 MED ORDER — BUPIVACAINE LIPOSOME 1.3 % IJ SUSP
INTRAMUSCULAR | Status: AC
  Filled 2021-10-23: qty 20

## 2021-10-23 MED ORDER — LIDOCAINE HCL (CARDIAC) PF 100 MG/5ML IV SOSY
PREFILLED_SYRINGE | INTRAVENOUS | Status: DC | PRN
  Administered 2021-10-23: 100 mg via INTRAVENOUS

## 2021-10-23 MED ORDER — ACETAMINOPHEN 500 MG PO TABS
ORAL_TABLET | ORAL | Status: AC
  Filled 2021-10-23: qty 2

## 2021-10-23 MED ORDER — ACETAMINOPHEN 500 MG PO TABS
1000.0000 mg | ORAL_TABLET | ORAL | Status: AC
  Administered 2021-10-23: 1000 mg via ORAL

## 2021-10-23 MED ORDER — GABAPENTIN 300 MG PO CAPS
300.0000 mg | ORAL_CAPSULE | ORAL | Status: AC
  Administered 2021-10-23: 300 mg via ORAL

## 2021-10-23 MED ORDER — ONDANSETRON HCL 4 MG/2ML IJ SOLN
INTRAMUSCULAR | Status: AC
  Filled 2021-10-23: qty 2

## 2021-10-23 MED ORDER — ALBUTEROL SULFATE HFA 108 (90 BASE) MCG/ACT IN AERS
INHALATION_SPRAY | RESPIRATORY_TRACT | Status: DC | PRN
  Administered 2021-10-23: 4 via RESPIRATORY_TRACT

## 2021-10-23 MED ORDER — CHLORHEXIDINE GLUCONATE CLOTH 2 % EX PADS: 6.0000 | MEDICATED_PAD | Freq: Once | CUTANEOUS | Status: DC

## 2021-10-23 MED ORDER — CHLORHEXIDINE GLUCONATE 0.12 % MT SOLN
OROMUCOSAL | Status: AC
  Administered 2021-10-23: 15 mL via OROMUCOSAL
  Filled 2021-10-23: qty 15

## 2021-10-23 MED ORDER — LIDOCAINE HCL (PF) 2 % IJ SOLN
INTRAMUSCULAR | Status: AC
  Filled 2021-10-23: qty 5

## 2021-10-23 MED ORDER — BUPIVACAINE-EPINEPHRINE (PF) 0.5% -1:200000 IJ SOLN
INTRAMUSCULAR | Status: AC
  Filled 2021-10-23: qty 30

## 2021-10-23 MED ORDER — ROCURONIUM BROMIDE 100 MG/10ML IV SOLN
INTRAVENOUS | Status: DC | PRN
  Administered 2021-10-23: 90 mg via INTRAVENOUS

## 2021-10-23 MED ORDER — ONDANSETRON HCL 4 MG/2ML IJ SOLN
INTRAMUSCULAR | Status: DC | PRN
  Administered 2021-10-23: 4 mg via INTRAVENOUS

## 2021-10-23 MED ORDER — FAMOTIDINE 20 MG PO TABS
ORAL_TABLET | ORAL | Status: AC
  Filled 2021-10-23: qty 1

## 2021-10-23 MED ORDER — OXYCODONE HCL 5 MG PO TABS: 5.0000 mg | ORAL_TABLET | Freq: Once | ORAL | Status: DC | PRN

## 2021-10-23 MED ORDER — SUGAMMADEX SODIUM 200 MG/2ML IV SOLN
INTRAVENOUS | Status: DC | PRN
  Administered 2021-10-23: 300 mg via INTRAVENOUS

## 2021-10-23 MED ORDER — ACETAMINOPHEN 10 MG/ML IV SOLN
INTRAVENOUS | Status: DC | PRN
  Administered 2021-10-23: 1000 mg via INTRAVENOUS

## 2021-10-23 MED ORDER — ACETAMINOPHEN 500 MG PO TABS: 1000.0000 mg | ORAL_TABLET | Freq: Four times a day (QID) | ORAL | Status: AC | PRN

## 2021-10-23 SURGICAL SUPPLY — 58 items
BINDER ABDOMINAL 12 ML 46-62 (SOFTGOODS) ×1 IMPLANT
BLADE SURG SZ11 CARB STEEL (BLADE) ×2 IMPLANT
CANNULA REDUC XI 12-8 STAPL (CANNULA) ×2
CANNULA REDUCER 12-8 DVNC XI (CANNULA) ×1 IMPLANT
COVER TIP SHEARS 8 DVNC (MISCELLANEOUS) ×1 IMPLANT
COVER TIP SHEARS 8MM DA VINCI (MISCELLANEOUS) ×2
DEFOGGER SCOPE WARMER CLEARIFY (MISCELLANEOUS) ×2 IMPLANT
DERMABOND ADVANCED (GAUZE/BANDAGES/DRESSINGS) ×1
DERMABOND ADVANCED .7 DNX12 (GAUZE/BANDAGES/DRESSINGS) ×1 IMPLANT
DRAPE ARM DVNC X/XI (DISPOSABLE) ×3 IMPLANT
DRAPE COLUMN DVNC XI (DISPOSABLE) ×1 IMPLANT
DRAPE DA VINCI XI ARM (DISPOSABLE) ×6
DRAPE DA VINCI XI COLUMN (DISPOSABLE) ×2
ELECT CAUTERY BLADE TIP 2.5 (TIP) ×2
ELECT REM PT RETURN 9FT ADLT (ELECTROSURGICAL) ×2
ELECTRODE CAUTERY BLDE TIP 2.5 (TIP) ×1 IMPLANT
ELECTRODE REM PT RTRN 9FT ADLT (ELECTROSURGICAL) ×1 IMPLANT
GLOVE SURG SYN 7.0 (GLOVE) ×4 IMPLANT
GLOVE SURG SYN 7.0 PF PI (GLOVE) ×2 IMPLANT
GLOVE SURG SYN 7.5  E (GLOVE) ×4
GLOVE SURG SYN 7.5 E (GLOVE) ×2 IMPLANT
GLOVE SURG SYN 7.5 PF PI (GLOVE) ×2 IMPLANT
GOWN STRL REUS W/ TWL LRG LVL3 (GOWN DISPOSABLE) ×3 IMPLANT
GOWN STRL REUS W/TWL LRG LVL3 (GOWN DISPOSABLE) ×6
GRASPER SUT TROCAR 14GX15 (MISCELLANEOUS) ×2 IMPLANT
IRRIGATION STRYKERFLOW (MISCELLANEOUS) IMPLANT
IRRIGATOR STRYKERFLOW (MISCELLANEOUS)
IV NS 1000ML (IV SOLUTION)
IV NS 1000ML BAXH (IV SOLUTION) IMPLANT
KIT PINK PAD W/HEAD ARE REST (MISCELLANEOUS) ×2
KIT PINK PAD W/HEAD ARM REST (MISCELLANEOUS) ×1 IMPLANT
LABEL OR SOLS (LABEL) ×2 IMPLANT
MANIFOLD NEPTUNE II (INSTRUMENTS) ×2 IMPLANT
MESH VENT LT ST 15CM CRL ECHO2 (Mesh General) ×1 IMPLANT
NDL INSUFFLATION 14GA 120MM (NEEDLE) ×1 IMPLANT
NEEDLE HYPO 22GX1.5 SAFETY (NEEDLE) ×2 IMPLANT
NEEDLE INSUFFLATION 14GA 120MM (NEEDLE) ×2 IMPLANT
OBTURATOR OPTICAL STANDARD 8MM (TROCAR) ×2
OBTURATOR OPTICAL STND 8 DVNC (TROCAR) ×1
OBTURATOR OPTICALSTD 8 DVNC (TROCAR) ×1 IMPLANT
PACK LAP CHOLECYSTECTOMY (MISCELLANEOUS) ×2 IMPLANT
PENCIL ELECTRO HAND CTR (MISCELLANEOUS) ×2 IMPLANT
SEAL CANN UNIV 5-8 DVNC XI (MISCELLANEOUS) ×2 IMPLANT
SEAL XI 5MM-8MM UNIVERSAL (MISCELLANEOUS) ×4
SET TUBE SMOKE EVAC HIGH FLOW (TUBING) ×2 IMPLANT
SOLUTION ELECTROLUBE (MISCELLANEOUS) ×2 IMPLANT
SPONGE T-LAP 18X18 ~~LOC~~+RFID (SPONGE) ×1 IMPLANT
STAPLER CANNULA SEAL DVNC XI (STAPLE) ×1 IMPLANT
STAPLER CANNULA SEAL XI (STAPLE) ×2
SUT MNCRL 4-0 (SUTURE) ×2
SUT MNCRL 4-0 27XMFL (SUTURE) ×1
SUT STRATAFIX PDS 30 CT-1 (SUTURE) ×2 IMPLANT
SUT VICRYL 0 AB UR-6 (SUTURE) ×4 IMPLANT
SUT VLOC 90 2/L VL 12 GS22 (SUTURE) ×4 IMPLANT
SUTURE MNCRL 4-0 27XMF (SUTURE) ×1 IMPLANT
TRAY FOLEY SLVR 16FR LF STAT (SET/KITS/TRAYS/PACK) ×2 IMPLANT
WAND RF SURG SPNG DETECT SYS (INSTRUMENTS) ×2 IMPLANT
WATER STERILE IRR 500ML POUR (IV SOLUTION) ×2 IMPLANT

## 2021-10-23 NOTE — Interval H&P Note (Signed)
History and Physical Interval Note:  10/23/2021 3:19 PM  Marissa Hayden  has presented today for surgery, with the diagnosis of incarcerated ventral hernia.  The various methods of treatment have been discussed with the patient and family. After consideration of risks, benefits and other options for treatment, the patient has consented to  Procedure(s) with comments: XI ROBOTIC [place], incarcerated (N/A) - Provider requesting 4 hours / 240 minutes for procedure. as a surgical intervention.  The patient's history has been reviewed, patient examined, no change in status, stable for surgery.  I have reviewed the patient's chart and labs.  Questions were answered to the patient's satisfaction.     Rashawn Rolon

## 2021-10-23 NOTE — Anesthesia Procedure Notes (Signed)
Procedure Name: Intubation Date/Time: 10/23/2021 4:11 PM Performed by: Johnna Acosta, CRNA Pre-anesthesia Checklist: Patient identified, Emergency Drugs available, Suction available, Patient being monitored and Timeout performed Patient Re-evaluated:Patient Re-evaluated prior to induction Oxygen Delivery Method: Circle system utilized Preoxygenation: Pre-oxygenation with 100% oxygen Induction Type: IV induction Ventilation: Mask ventilation with difficulty and Oral airway inserted - appropriate to patient size Laryngoscope Size: McGraph and 3 Grade View: Grade II Tube type: Oral Tube size: 7.0 mm Number of attempts: 1 Airway Equipment and Method: Stylet and Video-laryngoscopy Placement Confirmation: ETT inserted through vocal cords under direct vision, positive ETCO2 and breath sounds checked- equal and bilateral Secured at: 21 cm Tube secured with: Tape Dental Injury: Teeth and Oropharynx as per pre-operative assessment  Difficulty Due To: Difficulty was anticipated

## 2021-10-23 NOTE — Op Note (Signed)
°  Procedure Date:  10/23/2021  Pre-operative Diagnosis:  Incarcerated umbilical hernia  Post-operative Diagnosis:  Incarcerated umbilical hernia, 4.5 x 5.2 cm  Procedure:  Robotic assisted Incarcerated umbilical Hernia Repair with mesh  Surgeon:  Melvyn Neth, MD  Anesthesia:  General endotracheal  Estimated Blood Loss:  10 ml  Specimens:  None  Complications:  None  Indications for Procedure:  This is a 71 y.o. female who presents with an incarcerated umbilical hernia.  The options of surgery versus observation were reviewed with the patient and/or family. The risks of bleeding, abscess or infection, recurrence of symptoms, potential for an open procedure, injury to surrounding structures, and chronic pain were all discussed with the patient and was willing to proceed.  Description of Procedure: The patient was correctly identified in the preoperative area and brought into the operating room.  The patient was placed supine with VTE prophylaxis in place.  Appropriate time-outs were performed.  Anesthesia was induced and the patient was intubated.  Appropriate antibiotics were infused.  The abdomen was prepped and draped in a sterile fashion. The patient's hernia defect was marked with a marking pen.  A Veress needle was introduced in the left upper quadrant and pneumoperitoneum was obtained with appropriate pressures.  Using Optiview technique, an 8 mm port was introduced in the left lateral abdominal wall without complications.  Then, a 12 mm port was introduced in the left upper quadrant and an 8 mm port in the left lower quadrant under direct visualization.  The DaVinci platform was docked, camera targeted, and instruments placed under direct visualization.  The patient's hernia was found to containe transverse colon and omentum.  The hernia was fully reduced with combination of blunt dissection and cautery, and the peritoneum and preperitoneal fat were dissected to allow better  exposure of the hernia defect and for better mesh placement.  The hernia was measured to be 4.5 x 5.2 cm.  A 4 x 6 inch Bard Ventralight ST Echo 2 mesh, a 0 Stratafix suture, and two 2-0 V-loc sutures were inserted through the 12 mm port under direct visualization.  The hernia defect was closed using the stratafix suture.  A PMI was brought through the center of the hernia defect and the positioning system of the mesh was passed through.  The mesh was noted to be centered over the repair site with good overlap.  The mesh was then sutured in place circumferentially and through the center of the mesh using the V-loc sutures.  All needles and the positioning system were then removed through the 12 mm port without complications.  The DaVinci platform was then undocked and instruments removed.    60 ml of Exparel solution mixed with 0.5% bupivacaine with epi was infiltrated around the mesh edges, hernia repair site, and port sites.  The 12 mm port was removed and the fascia was closed under direct visualization utilizing an Endo Close technique with 0 Vicryl suture.  The 8 mm ports were removed. The 12 mm incision was closed using 3-0 Vicryl and 4-0 Monocryl, and the other port incisions were closed with 4-0 Monocryl.  The wounds were cleaned and sealed with DermaBond.  The patient was emerged from anesthesia and extubated and brought to the recovery room for further management.  The patient tolerated the procedure well and all counts were correct at the end of the case.   Melvyn Neth, MD

## 2021-10-23 NOTE — Brief Op Note (Signed)
10/23/2021  6:45 PM  PATIENT:  Marissa Hayden  71 y.o. female  PRE-OPERATIVE DIAGNOSIS:  incarcerated ventral hernia  POST-OPERATIVE DIAGNOSIS:  incarcerated ventral hernia  PROCEDURE:  Procedure(s) with comments: XI [place]; incarcerated (N/A) - Provider requesting 4 hours / 240 minutes for procedure.  SURGEON:  Surgeon(s) and Role:    * Sherry Blackard, MD - Primary  ANESTHESIA:   general  EBL:  10 mL   BLOOD ADMINISTERED:none  DRAINS: none   LOCAL MEDICATIONS USED:  BUPIVICAINE   SPECIMEN:  No Specimen  DISPOSITION OF SPECIMEN:  N/A  COUNTS:  YES  DICTATION: .Dragon Dictation  PLAN OF CARE: Discharge to home after PACU  PATIENT DISPOSITION:  PACU - hemodynamically stable.   Delay start of Pharmacological VTE agent (>24hrs) due to surgical blood loss or risk of bleeding: yes

## 2021-10-23 NOTE — Anesthesia Postprocedure Evaluation (Signed)
Anesthesia Post Note  Patient: Marissa Hayden  Procedure(s) Performed: XI ROBOTIC ASSISTED VENTRAL HERNIA WITH MESH; incarcerated  Patient location during evaluation: PACU Anesthesia Type: General Level of consciousness: awake and alert Pain management: pain level controlled Vital Signs Assessment: post-procedure vital signs reviewed and stable Respiratory status: spontaneous breathing, nonlabored ventilation, respiratory function stable and patient connected to nasal cannula oxygen Cardiovascular status: blood pressure returned to baseline and stable Postop Assessment: no apparent nausea or vomiting Anesthetic complications: no   No notable events documented.   Last Vitals:  Vitals:   10/23/21 1930 10/23/21 1937  BP: 113/63 127/62  Pulse: 88 91  Resp: 15 (!) 25  Temp:  36.7 C  SpO2: 93% 97%    Last Pain:  Vitals:   10/23/21 1937  TempSrc: Tympanic  PainSc: 0-No pain                 Arita Miss

## 2021-10-23 NOTE — Transfer of Care (Signed)
Immediate Anesthesia Transfer of Care Note  Patient: Marissa Hayden  Procedure(s) Performed: XI ROBOTIC ASSISTED VENTRAL HERNIA WITH MESH; incarcerated  Patient Location: PACU  Anesthesia Type:General  Level of Consciousness: drowsy and patient cooperative  Airway & Oxygen Therapy: Patient Spontanous Breathing and Patient connected to nasal cannula oxygen  Post-op Assessment: Report given to RN and Post -op Vital signs reviewed and stable  Post vital signs: Reviewed and stable  Last Vitals:  Vitals Value Taken Time  BP 125/72 10/23/21 1912  Temp    Pulse 88 10/23/21 1912  Resp 22 10/23/21 1912  SpO2 94 % 10/23/21 1912    Last Pain:  Vitals:   10/23/21 1223  TempSrc: Oral  PainSc: 0-No pain         Complications: No notable events documented.

## 2021-10-23 NOTE — Anesthesia Preprocedure Evaluation (Signed)
Anesthesia Evaluation  Patient identified by MRN, date of birth, ID band Patient awake    Reviewed: Allergy & Precautions, NPO status , Patient's Chart, lab work & pertinent test results  History of Anesthesia Complications Negative for: history of anesthetic complications  Airway Mallampati: II  TM Distance: >3 FB Neck ROM: Full    Dental no notable dental hx. (+) Poor Dentition, Missing   Pulmonary shortness of breath, at rest and Long-Term Oxygen Therapy, neg sleep apnea, COPD,  COPD inhaler and oxygen dependent, Patient abstained from smoking.Not current smoker, former smoker,     + decreased breath sounds      Cardiovascular Exercise Tolerance: Poor METS: < 3 Mets hypertension, (-) CAD and (-) Past MI (-) dysrhythmias  Rhythm:Regular Rate:Normal - Systolic murmurs    Neuro/Psych PSYCHIATRIC DISORDERS Anxiety Depression Bipolar Disorder negative neurological ROS     GI/Hepatic GERD  ,(+)     (-) substance abuse  ,   Endo/Other  neg diabetesMorbid obesity  Renal/GU negative Renal ROS     Musculoskeletal   Abdominal (+) + obese,   Peds  Hematology   Anesthesia Other Findings Past Medical History: No date: Anxiety No date: Bipolar disorder ([place]) No date: Complication of anesthesia     Comment:  pt states it takes alot to sedate her No date: COPD (chronic obstructive pulmonary disease) (HCC) No date: DDD (degenerative disc disease), cervical No date: Depression No date: Dyspnea No date: GERD (gastroesophageal reflux disease)     Comment:  occ No date: Hyperlipidemia No date: Hypertension No date: Obesity No date: Oxygen dependent     Comment:  Just started on 02 on 10-15-21 by pulmonologist-2 L               continous No date: RLS (restless legs syndrome)  Reproductive/Obstetrics                             Anesthesia Physical Anesthesia Plan  ASA: 4  Anesthesia Plan: General    Post-op Pain Management: Tylenol PO (pre-op), Gabapentin PO (pre-op) and Toradol IV (intra-op)   Induction: Intravenous  PONV Risk Score and Plan: 4 or greater and Ondansetron, Dexamethasone, Midazolam and Treatment may vary due to age or medical condition  Airway Management Planned: Oral ETT and Video Laryngoscope Planned  Additional Equipment: None  Intra-op Plan:   Post-operative Plan: Extubation in OR and Possible Post-op intubation/ventilation  Informed Consent: I have reviewed the patients History and Physical, chart, labs and discussed the procedure including the risks, benefits and alternatives for the proposed anesthesia with the patient or authorized representative who has indicated his/her understanding and acceptance.     Dental advisory given  Plan Discussed with: CRNA and Surgeon  Anesthesia Plan Comments: (Discussed risks of anesthesia with patient, including PONV, sore throat, lip/dental/eye damage. Rare risks discussed as well, such as cardiorespiratory and neurological sequelae, and allergic reactions. Discussed the role of CRNA in patient's perioperative care. Patient understands. Patient counseled on being higher risk for anesthesia due to comorbidities: O2-dependent COPD, morbid obesity. Patient was told about increased risk of cardiac and respiratory events, including death. )        Anesthesia Quick Evaluation

## 2021-10-23 NOTE — Discharge Instructions (Signed)
AMBULATORY SURGERY  DISCHARGE INSTRUCTIONS   The drugs that you were given will stay in your system until tomorrow so for the next 24 hours you should not:  Drive an automobile Make any legal decisions Drink any alcoholic beverage   You may resume regular meals tomorrow.  Today it is better to start with liquids and gradually work up to solid foods.  You may eat anything you prefer, but it is better to start with liquids, then soup and crackers, and gradually work up to solid foods.   Please notify your doctor immediately if you have any unusual bleeding, trouble breathing, redness and pain at the surgery site, drainage, fever, or pain not relieved by medication.    Your post-operative visit with Dr.                                       is: Date:                        Time:    Please call to schedule your post-operative visit.  Additional Instructions: LEAVE [place] ON FOR 4 DAYS

## 2021-10-24 ENCOUNTER — Telehealth: Payer: Self-pay | Admitting: Surgery

## 2021-10-24 ENCOUNTER — Encounter: Payer: Self-pay | Admitting: Surgery

## 2021-10-24 ENCOUNTER — Other Ambulatory Visit: Payer: Self-pay

## 2021-10-24 NOTE — Telephone Encounter (Signed)
Incoming call from patient. She scheduled  her post op for 11/05/21.  Had ventral hernia done on 10/23/21 with Dr. Hampton Abbot.    Patient specifically asking for Freda Munro to call her.  No fever, chills, vomiting or diarrhea.  States considering doing really well, but wants to know about lower stomach pain that feels like there is a bruise from the inside.  No problems with the surgical site.  Just has a few post op questions and would like for Freda Munro to call.  Thank you.

## 2021-10-24 NOTE — Telephone Encounter (Signed)
Ventral hernia 10/23/2021 Dr.Piscoya-questions regarding suture-and the glue. I let her know we do not need to remove the sutures. Patient states she is doing well.

## 2021-10-30 ENCOUNTER — Encounter: Payer: Self-pay | Admitting: Surgery

## 2021-11-05 ENCOUNTER — Ambulatory Visit (INDEPENDENT_AMBULATORY_CARE_PROVIDER_SITE_OTHER): Payer: Medicare HMO | Admitting: Surgery

## 2021-11-05 ENCOUNTER — Encounter: Payer: Self-pay | Admitting: Surgery

## 2021-11-05 ENCOUNTER — Other Ambulatory Visit: Payer: Self-pay

## 2021-11-05 VITALS — BP 114/77 | HR 112 | Temp 98.2°F | Ht 61.5 in | Wt 249.0 lb

## 2021-11-05 DIAGNOSIS — Z09 Encounter for follow-up examination after completed treatment for conditions other than malignant neoplasm: Secondary | ICD-10-CM | POA: Diagnosis not present

## 2021-11-05 DIAGNOSIS — K420 Umbilical hernia with obstruction, without gangrene: Secondary | ICD-10-CM | POA: Diagnosis not present

## 2021-11-05 NOTE — Patient Instructions (Addendum)
Try to wait until 6 weeks out from surgery before starting any steroids for your foot.   GENERAL POST-OPERATIVE PATIENT INSTRUCTIONS   WOUND CARE INSTRUCTIONS: Try to keep the wound dry and avoid ointments on the wound unless directed to do so.  If the wound becomes bright red and painful or starts to drain infected material that is not clear, please contact your physician immediately.  If the wound is mildly pink and has a thick firm ridge underneath it, this is normal, and is referred to as a healing ridge.  This will resolve over the next 4-6 weeks.  BATHING: You may shower if you have been informed of this by your surgeon. However, Please do not submerge in a tub, hot tub, or pool until incisions are completely sealed or have been told by your surgeon that you may do so.  DIET:  You may eat any foods that you can tolerate.  It is a good idea to eat a high fiber diet and take in plenty of fluids to prevent constipation.  If you do become constipated you may want to take a mild laxative or take ducolax tablets on a daily basis until your bowel habits are regular.  Constipation can be very uncomfortable, along with straining, after recent surgery.  ACTIVITY: You are encouraged to walk and engage in light activity for the next two weeks.  You should not lift more than 20 pounds for 6 weeks after surgery as it could put you at increased risk for complications.  Twenty pounds is roughly equivalent to a plastic bag of groceries. At that time- Listen to your body when lifting, if you have pain when lifting, stop and then try again in a few days. Soreness after doing exercises or activities of daily living is normal as you get back in to your normal routine.  MEDICATIONS:  Try to take narcotic medications and anti-inflammatory medications, such as tylenol, ibuprofen, naprosyn, etc., with food.  This will minimize stomach upset from the medication.  Should you develop nausea and vomiting from the pain  medication, or develop a rash, please discontinue the medication and contact your physician.  You should not drive, make important decisions, or operate machinery when taking narcotic pain medication.  SUNBLOCK Use sun block to incision area over the next year if this area will be exposed to sun. This helps decrease scarring and will allow you avoid a permanent darkened area over your incision.  QUESTIONS:  Please feel free to call our office if you have any questions, and we will be glad to assist you.

## 2021-11-05 NOTE — Progress Notes (Signed)
11/05/2021  History of Present Illness: Marissa Hayden is a 71 y.o. female s/p robotic assisted incarcerated umbilical hernia repair on 10/23/21.  Patient presents for follow up.  She reports she's doing very well and is very happy with her post-operative recovery.  Denies any significant pain.  She's wearing an abdominal belt to help with the hernia healing process and finds it to be very helpful particularly when she has to cough.  She's not needing any narcotics and has been taking only ibuprofen.  Past Medical History: Past Medical History:  Diagnosis Date   Anxiety    Bipolar disorder ([place])    Complication of anesthesia    pt states it takes alot to sedate her   COPD (chronic obstructive pulmonary disease) (HCC)    DDD (degenerative disc disease), cervical    Depression    Dyspnea    GERD (gastroesophageal reflux disease)    occ   Hyperlipidemia    Hypertension    Obesity    Oxygen dependent    Just started on 02 on 10-15-21 by pulmonologist-2 L continous   RLS (restless legs syndrome)      Past Surgical History: Past Surgical History:  Procedure Laterality Date   ANKLE FRACTURE SURGERY     ANKLE HARDWARE REMOVAL     AUGMENTATION MAMMAPLASTY Bilateral    over 30  years ago   CATARACT EXTRACTION Bilateral 2022   COLONOSCOPY     TUBAL LIGATION     XI ROBOTIC ASSISTED VENTRAL HERNIA N/A 10/23/2021   Procedure: XI ROBOTIC [place]; incarcerated;  Surgeon: Olean Ree, MD;  Location: [facility] ORS;  Service: General;  Laterality: N/A;  Provider requesting 4 hours / 240 minutes for procedure.    Home Medications: Prior to Admission medications   Medication Sig Start Date End Date Taking? Authorizing Provider  acetaminophen (TYLENOL) 500 MG tablet Take 2 tablets (1,000 mg total) by mouth every 6 (six) hours as needed for mild pain. 10/23/21  Yes Joclynn Lumb, Jacqulyn Bath, MD  albuterol (PROVENTIL HFA;VENTOLIN HFA) 108 (90 Base) MCG/ACT inhaler Inhale 2 puffs  into the lungs every 6 (six) hours as needed for wheezing or shortness of breath. 07/28/18  Yes Merlyn Lot, MD  citalopram (CELEXA) 20 MG tablet Take 20 mg by mouth every morning. 12/04/18  Yes [provider]  ibuprofen (ADVIL) 600 MG tablet Take 1 tablet (600 mg total) by mouth every 8 (eight) hours as needed for moderate pain. 10/23/21  Yes Dariella Gillihan, MD  ipratropium-albuterol (DUONEB) 0.5-2.5 (3) MG/3ML SOLN Take 3 mLs by nebulization every 6 (six) hours as needed. 07/28/18  Yes Merlyn Lot, MD  lisinopril (ZESTRIL) 10 MG tablet Take 10 mg by mouth every morning. 04/02/19  Yes [provider]  oxybutynin (DITROPAN-XL) 10 MG 24 hr tablet Take 10 mg by mouth at bedtime.   Yes [provider]  oxyCODONE-acetaminophen (PERCOCET/ROXICET) 5-325 MG tablet Take 1-2 tablets by mouth every 6 (six) hours as needed for moderate pain. 09/03/19  Yes [provider]  OXYGEN Inhale 2 L into the lungs continuous.   Yes [provider]  simvastatin (ZOCOR) 40 MG tablet Take 40 mg by mouth every morning. 04/02/19  Yes [provider]    Allergies: No Known Allergies  Review of Systems: Review of Systems  Constitutional:  Negative for chills and fever.  Respiratory:  Negative for shortness of breath.   Cardiovascular:  Negative for chest pain.  Gastrointestinal:  Negative for abdominal pain, nausea and  vomiting.   Physical Exam BP 114/77    Pulse (!) 112    Temp 98.2 F (36.8 C)    Ht 5' 1.5" (1.562 m)    Wt 249 lb (112.9 kg)    SpO2 94%    BMI 46.29 kg/m  CONSTITUTIONAL: No acute distress HEENT:  Normocephalic, atraumatic, extraocular motion intact. RESPIRATORY:  Has nasal cannula in place, no respiratory distress or use of accessory muscles. CARDIOVASCULAR: Regular rhythm and rate. GI: The abdomen is soft, obese, non-distended, not tender to palpation.  Three left lateral incisions are healing well.  No evidence of hernia recurrence.   Only some firmness at the base of the umbilicus consistent with scarring.  NEUROLOGIC:  Motor and sensation is grossly normal.  Cranial nerves are grossly intact. PSYCH:  Alert and oriented to person, place and time. Affect is normal.   Assessment and Plan: This is a 71 y.o. female s/p robotic assisted incarcerated umbilical hernia repair.  --Patient is doing very well without any complications at this point. Incisions are healing well and there's no evidence of hernia recurrence. --Recommended that she continue wearing her abdominal belt or binder for 4 weeks after surgery to help with the wound healing process and decrease the amount of tension on the sutures/mesh. --Follow up as needed.  I spent 15 minutes dedicated to the care of this patient on the date of this encounter to include pre-visit review of records, face-to-face time with the patient discussing diagnosis and management, and any post-visit coordination of care.   Melvyn Neth, [facility]

## 2021-11-07 ENCOUNTER — Encounter: Payer: Medicare HMO | Admitting: Surgery

## 2021-11-15 DIAGNOSIS — I10 Essential (primary) hypertension: Secondary | ICD-10-CM | POA: Diagnosis not present

## 2021-11-15 DIAGNOSIS — J449 Chronic obstructive pulmonary disease, unspecified: Secondary | ICD-10-CM | POA: Diagnosis not present

## 2021-11-22 DIAGNOSIS — R8290 Unspecified abnormal findings in urine: Secondary | ICD-10-CM | POA: Diagnosis not present

## 2021-11-22 DIAGNOSIS — J449 Chronic obstructive pulmonary disease, unspecified: Secondary | ICD-10-CM | POA: Diagnosis not present

## 2021-11-22 DIAGNOSIS — I10 Essential (primary) hypertension: Secondary | ICD-10-CM | POA: Diagnosis not present

## 2021-11-22 DIAGNOSIS — Z0000 Encounter for general adult medical examination without abnormal findings: Secondary | ICD-10-CM | POA: Diagnosis not present

## 2021-11-22 DIAGNOSIS — E7800 Pure hypercholesterolemia, unspecified: Secondary | ICD-10-CM | POA: Diagnosis not present

## 2021-11-22 DIAGNOSIS — R300 Dysuria: Secondary | ICD-10-CM | POA: Diagnosis not present

## 2021-11-22 DIAGNOSIS — F319 Bipolar disorder, unspecified: Secondary | ICD-10-CM | POA: Diagnosis not present

## 2021-12-13 DIAGNOSIS — J449 Chronic obstructive pulmonary disease, unspecified: Secondary | ICD-10-CM | POA: Diagnosis not present

## 2022-01-02 ENCOUNTER — Ambulatory Visit
Admission: RE | Admit: 2022-01-02 | Discharge: 2022-01-02 | Disposition: A | Discharge status: Home / Self Care | Payer: Medicare HMO | Source: Ambulatory Visit | Attending: Internal Medicine | Admitting: Internal Medicine

## 2022-01-02 DIAGNOSIS — Z1231 Encounter for screening mammogram for malignant neoplasm of breast: Secondary | ICD-10-CM | POA: Diagnosis not present

## 2022-01-13 DIAGNOSIS — J449 Chronic obstructive pulmonary disease, unspecified: Secondary | ICD-10-CM | POA: Diagnosis not present

## 2022-02-12 DIAGNOSIS — J449 Chronic obstructive pulmonary disease, unspecified: Secondary | ICD-10-CM | POA: Diagnosis not present

## 2022-02-28 DIAGNOSIS — I10 Essential (primary) hypertension: Secondary | ICD-10-CM | POA: Diagnosis not present

## 2022-02-28 DIAGNOSIS — M5030 Other cervical disc degeneration, unspecified cervical region: Secondary | ICD-10-CM | POA: Diagnosis not present

## 2022-02-28 DIAGNOSIS — F319 Bipolar disorder, unspecified: Secondary | ICD-10-CM | POA: Diagnosis not present

## 2022-02-28 DIAGNOSIS — J449 Chronic obstructive pulmonary disease, unspecified: Secondary | ICD-10-CM | POA: Diagnosis not present

## 2022-02-28 DIAGNOSIS — E7800 Pure hypercholesterolemia, unspecified: Secondary | ICD-10-CM | POA: Diagnosis not present

## 2022-03-14 DIAGNOSIS — R0609 Other forms of dyspnea: Secondary | ICD-10-CM | POA: Diagnosis not present

## 2022-03-14 DIAGNOSIS — J449 Chronic obstructive pulmonary disease, unspecified: Secondary | ICD-10-CM | POA: Diagnosis not present

## 2022-03-14 DIAGNOSIS — Z9981 Dependence on supplemental oxygen: Secondary | ICD-10-CM | POA: Diagnosis not present

## 2022-03-15 DIAGNOSIS — J449 Chronic obstructive pulmonary disease, unspecified: Secondary | ICD-10-CM | POA: Diagnosis not present

## 2022-04-16 ENCOUNTER — Telehealth: Payer: Self-pay

## 2022-04-16 NOTE — Telephone Encounter (Signed)
Patient called at this time-She stated she is being charged for an office viist in February . She had surgery in January and this charge should fall under the surgical fee. I text Lattie Haw and she will look into this.

## 2022-06-18 DIAGNOSIS — J449 Chronic obstructive pulmonary disease, unspecified: Secondary | ICD-10-CM | POA: Diagnosis not present

## 2022-06-18 DIAGNOSIS — E7800 Pure hypercholesterolemia, unspecified: Secondary | ICD-10-CM | POA: Diagnosis not present

## 2022-06-25 ENCOUNTER — Encounter: Payer: Self-pay | Admitting: Surgery

## 2022-06-25 DIAGNOSIS — Z0001 Encounter for general adult medical examination with abnormal findings: Secondary | ICD-10-CM | POA: Diagnosis not present

## 2022-06-25 DIAGNOSIS — J9611 Chronic respiratory failure with hypoxia: Secondary | ICD-10-CM | POA: Diagnosis not present

## 2022-06-25 DIAGNOSIS — I10 Essential (primary) hypertension: Secondary | ICD-10-CM | POA: Diagnosis not present

## 2022-06-25 DIAGNOSIS — J449 Chronic obstructive pulmonary disease, unspecified: Secondary | ICD-10-CM | POA: Diagnosis not present

## 2022-06-25 DIAGNOSIS — E7800 Pure hypercholesterolemia, unspecified: Secondary | ICD-10-CM | POA: Diagnosis not present

## 2022-06-25 DIAGNOSIS — F319 Bipolar disorder, unspecified: Secondary | ICD-10-CM | POA: Diagnosis not present

## 2022-08-19 DIAGNOSIS — R0602 Shortness of breath: Secondary | ICD-10-CM | POA: Diagnosis not present

## 2022-08-19 DIAGNOSIS — J449 Chronic obstructive pulmonary disease, unspecified: Secondary | ICD-10-CM | POA: Diagnosis not present

## 2022-09-30 DIAGNOSIS — F319 Bipolar disorder, unspecified: Secondary | ICD-10-CM | POA: Diagnosis not present

## 2022-09-30 DIAGNOSIS — I10 Essential (primary) hypertension: Secondary | ICD-10-CM | POA: Diagnosis not present

## 2022-09-30 DIAGNOSIS — J449 Chronic obstructive pulmonary disease, unspecified: Secondary | ICD-10-CM | POA: Diagnosis not present

## 2022-09-30 DIAGNOSIS — Z6841 Body Mass Index (BMI) 40.0 and over, adult: Secondary | ICD-10-CM | POA: Diagnosis not present

## 2022-09-30 DIAGNOSIS — M5030 Other cervical disc degeneration, unspecified cervical region: Secondary | ICD-10-CM | POA: Diagnosis not present

## 2022-09-30 DIAGNOSIS — J9611 Chronic respiratory failure with hypoxia: Secondary | ICD-10-CM | POA: Diagnosis not present

## 2022-12-25 DIAGNOSIS — J449 Chronic obstructive pulmonary disease, unspecified: Secondary | ICD-10-CM | POA: Diagnosis not present

## 2023-01-01 DIAGNOSIS — Z1331 Encounter for screening for depression: Secondary | ICD-10-CM | POA: Diagnosis not present

## 2023-01-01 DIAGNOSIS — J9611 Chronic respiratory failure with hypoxia: Secondary | ICD-10-CM | POA: Diagnosis not present

## 2023-01-01 DIAGNOSIS — J449 Chronic obstructive pulmonary disease, unspecified: Secondary | ICD-10-CM | POA: Diagnosis not present

## 2023-01-01 DIAGNOSIS — M5030 Other cervical disc degeneration, unspecified cervical region: Secondary | ICD-10-CM | POA: Diagnosis not present

## 2023-01-01 DIAGNOSIS — Z0000 Encounter for general adult medical examination without abnormal findings: Secondary | ICD-10-CM | POA: Diagnosis not present

## 2023-01-01 DIAGNOSIS — I10 Essential (primary) hypertension: Secondary | ICD-10-CM | POA: Diagnosis not present

## 2023-01-01 DIAGNOSIS — E7800 Pure hypercholesterolemia, unspecified: Secondary | ICD-10-CM | POA: Diagnosis not present

## 2023-01-15 DIAGNOSIS — R0609 Other forms of dyspnea: Secondary | ICD-10-CM | POA: Diagnosis not present

## 2023-01-15 DIAGNOSIS — J449 Chronic obstructive pulmonary disease, unspecified: Secondary | ICD-10-CM | POA: Diagnosis not present

## 2023-01-15 DIAGNOSIS — Z9981 Dependence on supplemental oxygen: Secondary | ICD-10-CM | POA: Diagnosis not present

## 2023-01-29 IMAGING — CT CT ABD-PELV W/ CM
2 of 5 series · 16 of 46 positions shown, 18 images · IV contrast (APPLIED)
Comparison: None.

CLINICAL DATA: Ventral hernia, bowel obstruction suspected.

EXAM:
CT ABDOMEN AND PELVIS WITH CONTRAST
TECHNIQUE: Multidetector CT imaging of the abdomen and pelvis was performed
using the standard protocol following bolus administration of
intravenous contrast.
CONTRAST:  100mL OMNIPAQUE IOHEXOL 300 MG/ML  SOLN

[Series 2: routine abd/pel with · axial · 0.91mm/px · z∈[-356,+54]mm · 13 of 92 slices shown, 15 images]
[im 5/92  soft-tissue]
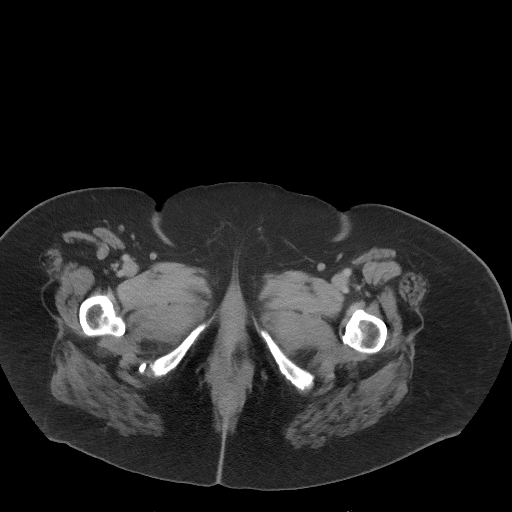
[im 5/92  bone]
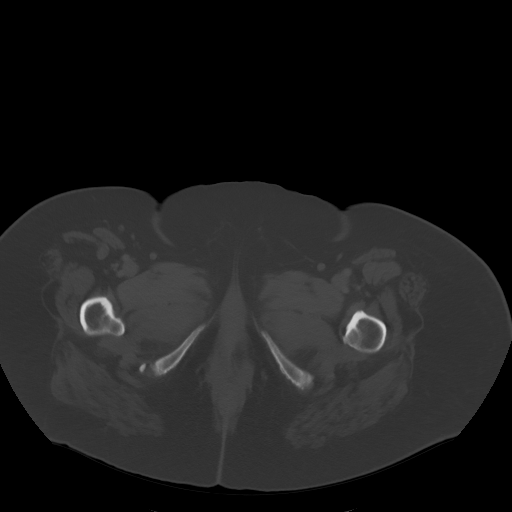
[im 15/92  soft-tissue]
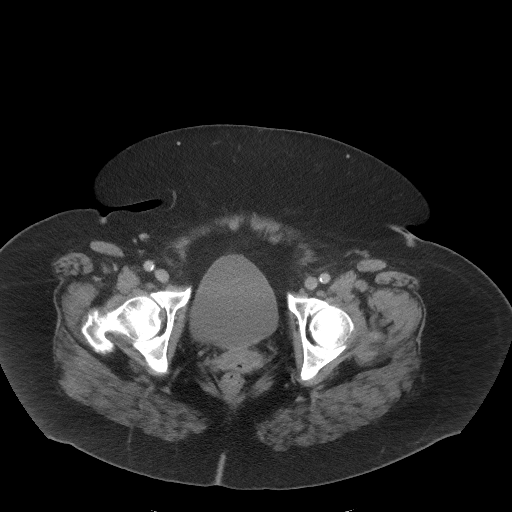
[im 20/92  soft-tissue]
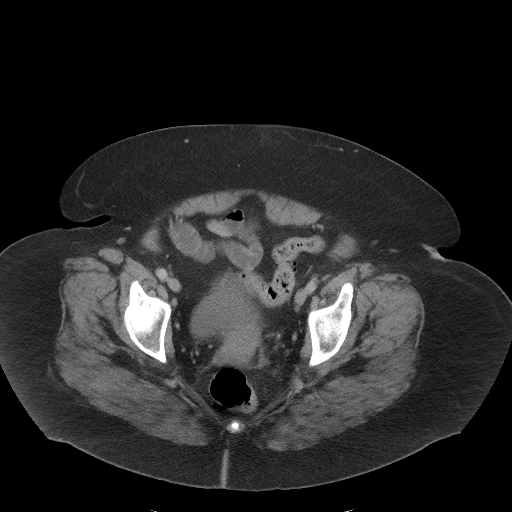
[im 24/92  soft-tissue]
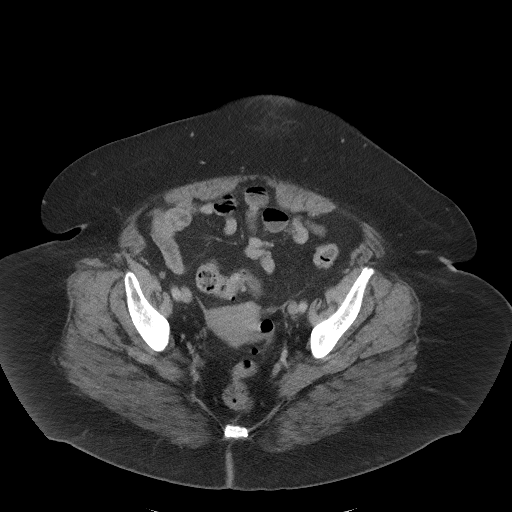
[im 34/92  soft-tissue]
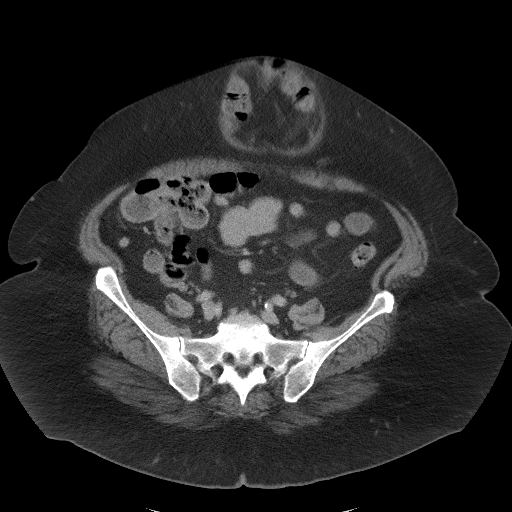
[im 39/92  soft-tissue]
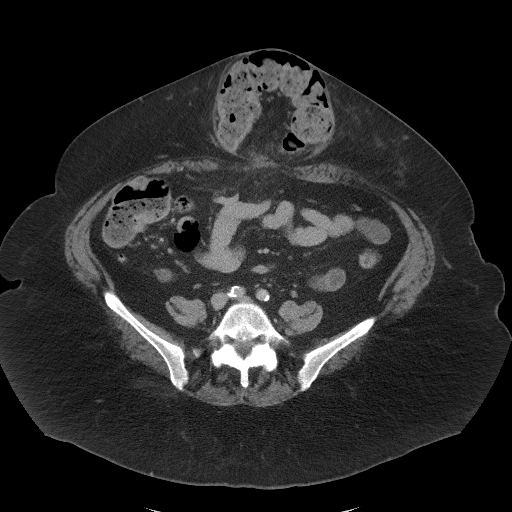
[im 48/92  soft-tissue]
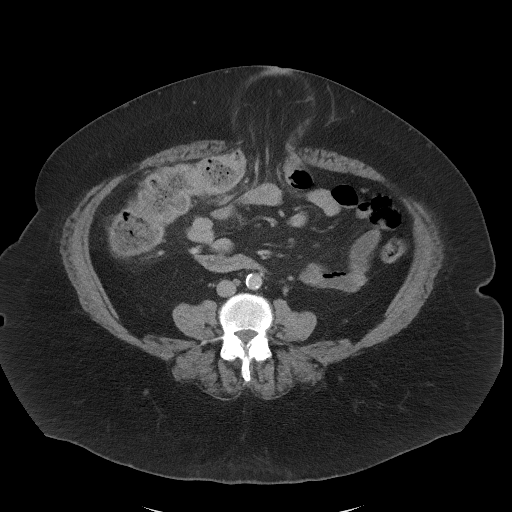
[im 53/92  soft-tissue]
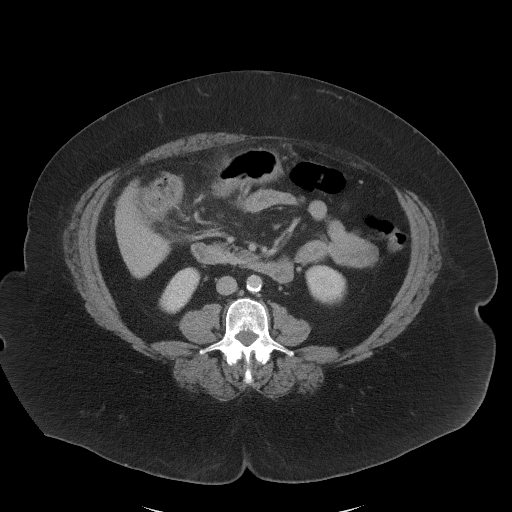
[im 58/92  soft-tissue]
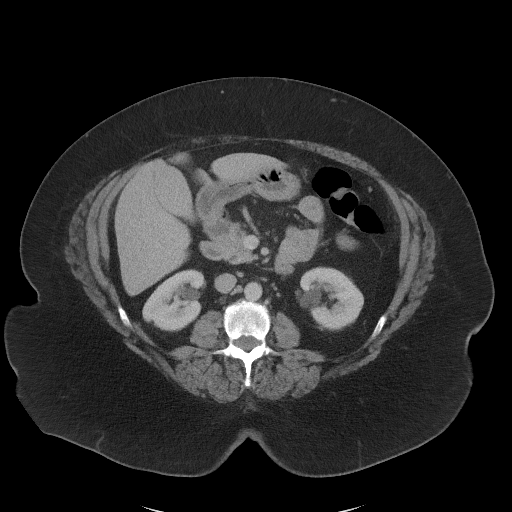
[im 58/92  bone]
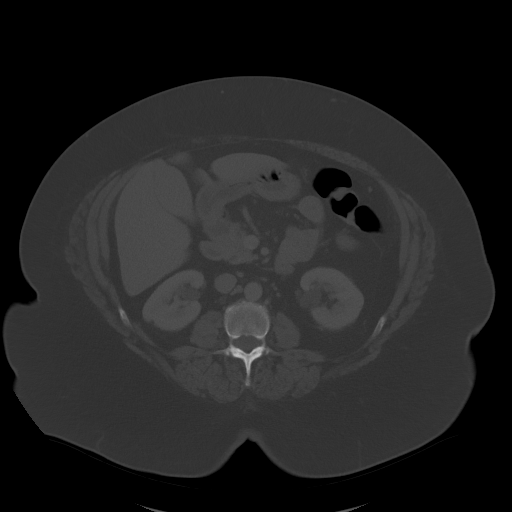
[im 68/92  soft-tissue]
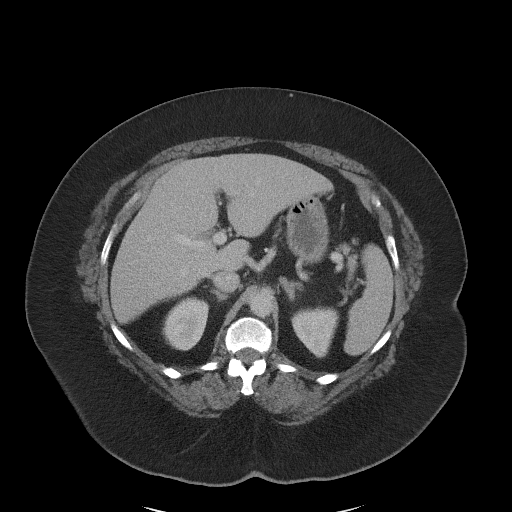
[im 72/92  soft-tissue]
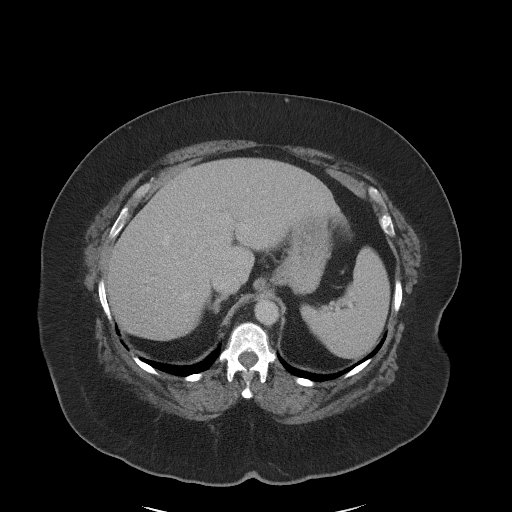
[im 77/92  soft-tissue]
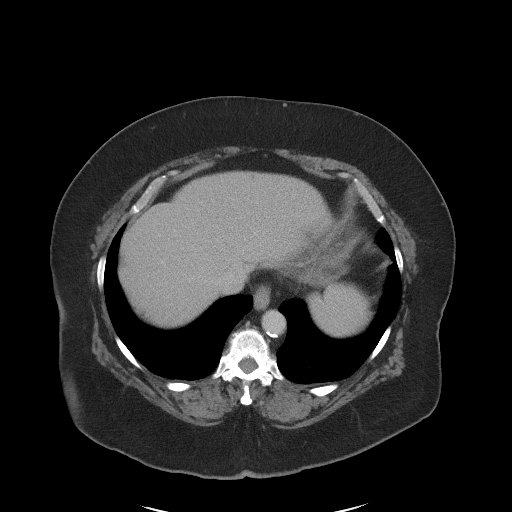
[im 87/92  soft-tissue]
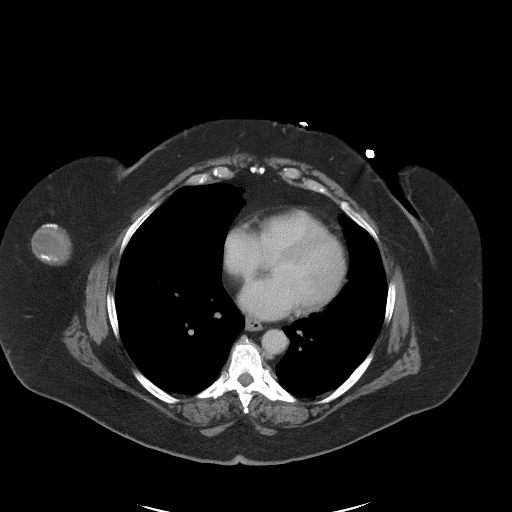

[Series 5: coronal st · coronal · 0.95mm/px · 3 of 139 slices shown]
[im 47/139  soft-tissue]
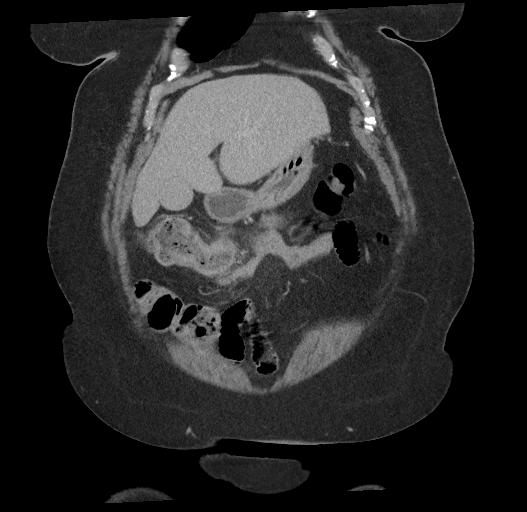
[im 62/139  soft-tissue]
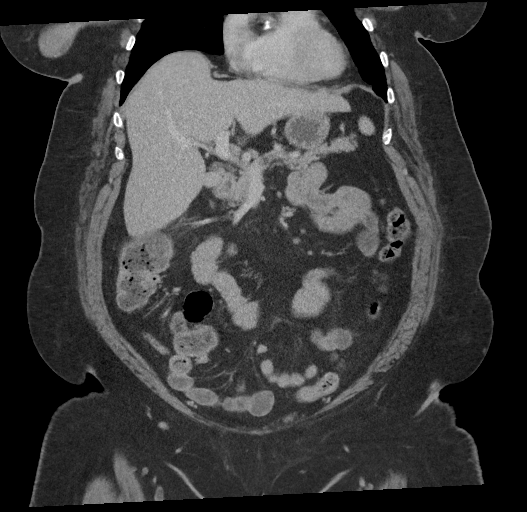
[im 77/139  soft-tissue]
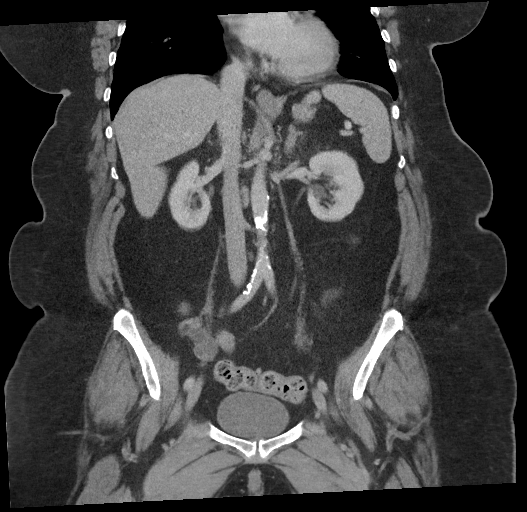

[16 of 46 positions shown; findings below may reference images not displayed]

FINDINGS: Lower chest: No acute abnormality. Partially calcified bilateral
collections reflecting ruptured silicone implants seen on prior
mammography.

Hepatobiliary: No suspicious hepatic lesion. Vicarious secretion of
contrast into the gallbladder. No biliary ductal dilation or
evidence of acute gallbladder inflammation.

Pancreas: No pancreatic ductal dilation or evidence of acute
inflammation.

Spleen: Normal size spleen.

Adrenals/Urinary Tract: Bilateral adrenal glands appear normal. No
hydronephrosis. No solid enhancing renal mass. Urinary bladder is
unremarkable for degree of distension.

Stomach/Bowel: No enteric contrast was administered. Tiny hiatal
hernia. Stomach is predominantly decompressed limiting evaluation.
No pathologic dilation of small or large bowel. The appendix and
terminal ileum appear normal. Scattered sigmoid colonic
diverticulosis without findings of acute diverticulitis.

Mild inflammation within a large ventral hernia containing fat and
nonobstructed portion of colon with a 5.3 cm aperture width.

Vascular/Lymphatic: Aortic and branch vessel atherosclerosis without
abdominal aortic aneurysm. No pathologically enlarged abdominal or
pelvic lymph nodes.

Reproductive: Calcified uterine leiomyomas. No suspicious adnexal
masses.

Other: No significant abdominopelvic free fluid.

Musculoskeletal: Thoracolumbar spondylosis. No acute osseous
abnormality.
IMPRESSION: 1. Mild inflammation within a large ventral hernia containing fat
and nonobstructed portion of colon with a 5.3 cm aperture, recommend
clinical correlation for reducibility.
2. Sigmoid colonic diverticulosis without findings of acute
diverticulitis.
3. Calcified uterine leiomyomas.
4.  Aortic Atherosclerosis (JAIRM-K90.0).

## 2023-05-07 DIAGNOSIS — F319 Bipolar disorder, unspecified: Secondary | ICD-10-CM | POA: Diagnosis not present

## 2023-05-07 DIAGNOSIS — Z0283 Encounter for blood-alcohol and blood-drug test: Secondary | ICD-10-CM | POA: Diagnosis not present

## 2023-05-07 DIAGNOSIS — G894 Chronic pain syndrome: Secondary | ICD-10-CM | POA: Diagnosis not present

## 2023-06-27 DIAGNOSIS — R069 Unspecified abnormalities of breathing: Secondary | ICD-10-CM | POA: Diagnosis not present

## 2023-06-27 DIAGNOSIS — R000 Tachycardia, unspecified: Secondary | ICD-10-CM | POA: Diagnosis not present

## 2023-06-27 DIAGNOSIS — R457 State of emotional shock and stress, unspecified: Secondary | ICD-10-CM | POA: Diagnosis not present

## 2023-06-27 DIAGNOSIS — I10 Essential (primary) hypertension: Secondary | ICD-10-CM | POA: Diagnosis not present

## 2023-07-31 DIAGNOSIS — J449 Chronic obstructive pulmonary disease, unspecified: Secondary | ICD-10-CM | POA: Diagnosis not present

## 2023-07-31 DIAGNOSIS — Z9981 Dependence on supplemental oxygen: Secondary | ICD-10-CM | POA: Diagnosis not present

## 2023-07-31 DIAGNOSIS — E7800 Pure hypercholesterolemia, unspecified: Secondary | ICD-10-CM | POA: Diagnosis not present

## 2023-07-31 DIAGNOSIS — R0609 Other forms of dyspnea: Secondary | ICD-10-CM | POA: Diagnosis not present

## 2023-08-07 DIAGNOSIS — F411 Generalized anxiety disorder: Secondary | ICD-10-CM | POA: Diagnosis not present

## 2023-08-07 DIAGNOSIS — F319 Bipolar disorder, unspecified: Secondary | ICD-10-CM | POA: Diagnosis not present

## 2023-08-07 DIAGNOSIS — I10 Essential (primary) hypertension: Secondary | ICD-10-CM | POA: Diagnosis not present

## 2023-08-07 DIAGNOSIS — J9611 Chronic respiratory failure with hypoxia: Secondary | ICD-10-CM | POA: Diagnosis not present

## 2023-08-07 DIAGNOSIS — J449 Chronic obstructive pulmonary disease, unspecified: Secondary | ICD-10-CM | POA: Diagnosis not present

## 2023-08-07 DIAGNOSIS — E7800 Pure hypercholesterolemia, unspecified: Secondary | ICD-10-CM | POA: Diagnosis not present

## 2023-10-28 DIAGNOSIS — H524 Presbyopia: Secondary | ICD-10-CM | POA: Diagnosis not present

## 2023-12-05 DIAGNOSIS — Z9981 Dependence on supplemental oxygen: Secondary | ICD-10-CM | POA: Diagnosis not present

## 2023-12-05 DIAGNOSIS — Z6841 Body Mass Index (BMI) 40.0 and over, adult: Secondary | ICD-10-CM | POA: Diagnosis not present

## 2023-12-05 DIAGNOSIS — J449 Chronic obstructive pulmonary disease, unspecified: Secondary | ICD-10-CM | POA: Diagnosis not present

## 2023-12-05 DIAGNOSIS — F17210 Nicotine dependence, cigarettes, uncomplicated: Secondary | ICD-10-CM | POA: Diagnosis not present

## 2023-12-19 DIAGNOSIS — J9611 Chronic respiratory failure with hypoxia: Secondary | ICD-10-CM | POA: Diagnosis not present

## 2023-12-19 DIAGNOSIS — G894 Chronic pain syndrome: Secondary | ICD-10-CM | POA: Diagnosis not present

## 2023-12-19 DIAGNOSIS — J449 Chronic obstructive pulmonary disease, unspecified: Secondary | ICD-10-CM | POA: Diagnosis not present

## 2023-12-19 DIAGNOSIS — Z0000 Encounter for general adult medical examination without abnormal findings: Secondary | ICD-10-CM | POA: Diagnosis not present

## 2023-12-19 DIAGNOSIS — F319 Bipolar disorder, unspecified: Secondary | ICD-10-CM | POA: Diagnosis not present

## 2023-12-19 DIAGNOSIS — F17210 Nicotine dependence, cigarettes, uncomplicated: Secondary | ICD-10-CM | POA: Diagnosis not present

## 2024-03-19 DIAGNOSIS — G894 Chronic pain syndrome: Secondary | ICD-10-CM | POA: Diagnosis not present

## 2024-03-19 DIAGNOSIS — M5030 Other cervical disc degeneration, unspecified cervical region: Secondary | ICD-10-CM | POA: Diagnosis not present

## 2024-03-19 DIAGNOSIS — J449 Chronic obstructive pulmonary disease, unspecified: Secondary | ICD-10-CM | POA: Diagnosis not present

## 2024-04-06 DIAGNOSIS — F17210 Nicotine dependence, cigarettes, uncomplicated: Secondary | ICD-10-CM | POA: Diagnosis not present

## 2024-04-06 DIAGNOSIS — J449 Chronic obstructive pulmonary disease, unspecified: Secondary | ICD-10-CM | POA: Diagnosis not present

## 2024-04-06 DIAGNOSIS — R0609 Other forms of dyspnea: Secondary | ICD-10-CM | POA: Diagnosis not present

## 2024-04-06 DIAGNOSIS — Z9981 Dependence on supplemental oxygen: Secondary | ICD-10-CM | POA: Diagnosis not present

## 2024-04-12 ENCOUNTER — Other Ambulatory Visit: Payer: Self-pay | Admitting: Specialist

## 2024-04-12 DIAGNOSIS — J449 Chronic obstructive pulmonary disease, unspecified: Secondary | ICD-10-CM

## 2024-04-12 DIAGNOSIS — F17210 Nicotine dependence, cigarettes, uncomplicated: Secondary | ICD-10-CM

## 2024-06-08 DIAGNOSIS — E7800 Pure hypercholesterolemia, unspecified: Secondary | ICD-10-CM | POA: Diagnosis not present

## 2024-06-08 DIAGNOSIS — R739 Hyperglycemia, unspecified: Secondary | ICD-10-CM | POA: Diagnosis not present

## 2024-06-15 DIAGNOSIS — Z1331 Encounter for screening for depression: Secondary | ICD-10-CM | POA: Diagnosis not present

## 2024-06-15 DIAGNOSIS — J9611 Chronic respiratory failure with hypoxia: Secondary | ICD-10-CM | POA: Diagnosis not present

## 2024-06-15 DIAGNOSIS — E7800 Pure hypercholesterolemia, unspecified: Secondary | ICD-10-CM | POA: Diagnosis not present

## 2024-06-15 DIAGNOSIS — J449 Chronic obstructive pulmonary disease, unspecified: Secondary | ICD-10-CM | POA: Diagnosis not present

## 2024-06-15 DIAGNOSIS — F17210 Nicotine dependence, cigarettes, uncomplicated: Secondary | ICD-10-CM | POA: Diagnosis not present

## 2024-06-15 DIAGNOSIS — I10 Essential (primary) hypertension: Secondary | ICD-10-CM | POA: Diagnosis not present

## 2024-06-15 DIAGNOSIS — Z0001 Encounter for general adult medical examination with abnormal findings: Secondary | ICD-10-CM | POA: Diagnosis not present

## 2024-06-15 DIAGNOSIS — G2581 Restless legs syndrome: Secondary | ICD-10-CM | POA: Diagnosis not present

## 2024-06-15 DIAGNOSIS — F319 Bipolar disorder, unspecified: Secondary | ICD-10-CM | POA: Diagnosis not present

## 2024-09-09 DIAGNOSIS — F17210 Nicotine dependence, cigarettes, uncomplicated: Secondary | ICD-10-CM

## 2024-09-09 DIAGNOSIS — J449 Chronic obstructive pulmonary disease, unspecified: Secondary | ICD-10-CM
